# Patient Record
Sex: Male | Born: 1950 | Race: White | Hispanic: No | Marital: Married
Health system: Southern US, Community
[De-identification: ages and names within clinical notes are randomized; demographics above are authoritative.]

## PROBLEM LIST (undated history)

## (undated) DIAGNOSIS — J9621 Acute and chronic respiratory failure with hypoxia: Secondary | ICD-10-CM

## (undated) DIAGNOSIS — J80 Acute respiratory distress syndrome: Secondary | ICD-10-CM

## (undated) DIAGNOSIS — Z930 Tracheostomy status: Secondary | ICD-10-CM

## (undated) DIAGNOSIS — U071 COVID-19: Secondary | ICD-10-CM

## (undated) DIAGNOSIS — J939 Pneumothorax, unspecified: Secondary | ICD-10-CM

---

## 2020-08-22 ENCOUNTER — Other Ambulatory Visit (HOSPITAL_COMMUNITY): Payer: Medicare Other

## 2020-08-22 ENCOUNTER — Inpatient Hospital Stay
Admission: AD | Admit: 2020-08-22 | Discharge: 2020-09-23 | Disposition: A | Discharge status: Skilled Nursing Facility | Payer: Medicare Other | Source: Other Acute Inpatient Hospital | Attending: Internal Medicine | Admitting: Internal Medicine

## 2020-08-22 DIAGNOSIS — U071 COVID-19: Secondary | ICD-10-CM | POA: Diagnosis present

## 2020-08-22 DIAGNOSIS — J9690 Respiratory failure, unspecified, unspecified whether with hypoxia or hypercapnia: Secondary | ICD-10-CM

## 2020-08-22 DIAGNOSIS — Z95828 Presence of other vascular implants and grafts: Secondary | ICD-10-CM

## 2020-08-22 DIAGNOSIS — J189 Pneumonia, unspecified organism: Secondary | ICD-10-CM

## 2020-08-22 DIAGNOSIS — Z931 Gastrostomy status: Secondary | ICD-10-CM

## 2020-08-22 DIAGNOSIS — J9621 Acute and chronic respiratory failure with hypoxia: Secondary | ICD-10-CM | POA: Diagnosis present

## 2020-08-22 DIAGNOSIS — J942 Hemothorax: Secondary | ICD-10-CM

## 2020-08-22 DIAGNOSIS — R109 Unspecified abdominal pain: Secondary | ICD-10-CM

## 2020-08-22 DIAGNOSIS — J939 Pneumothorax, unspecified: Secondary | ICD-10-CM

## 2020-08-22 DIAGNOSIS — Z930 Tracheostomy status: Secondary | ICD-10-CM

## 2020-08-22 DIAGNOSIS — J80 Acute respiratory distress syndrome: Secondary | ICD-10-CM | POA: Diagnosis present

## 2020-08-22 DIAGNOSIS — Z4682 Encounter for fitting and adjustment of non-vascular catheter: Secondary | ICD-10-CM

## 2020-08-22 HISTORY — DX: Tracheostomy status: Z93.0

## 2020-08-22 HISTORY — DX: Acute and chronic respiratory failure with hypoxia: J96.21

## 2020-08-22 HISTORY — DX: Acute respiratory distress syndrome: J80

## 2020-08-22 HISTORY — DX: Pneumothorax, unspecified: J93.9

## 2020-08-22 HISTORY — DX: COVID-19: U07.1

## 2020-08-22 LAB — BLOOD GAS, ARTERIAL
Acid-Base Excess: 5.1 mmol/L — ABNORMAL HIGH (ref 0.0–2.0)
Bicarbonate: 29.5 mmol/L — ABNORMAL HIGH (ref 20.0–28.0)
FIO2: 40
O2 Saturation: 96.1 %
Patient temperature: 36.9
pCO2 arterial: 47.3 mmHg (ref 32.0–48.0)
pH, Arterial: 7.411 (ref 7.350–7.450)
pO2, Arterial: 89.2 mmHg (ref 83.0–108.0)

## 2020-08-22 MED ORDER — IOHEXOL 300 MG/ML  SOLN: 50.0000 mL | Freq: Once | INTRAMUSCULAR | Status: DC | PRN

## 2020-08-23 ENCOUNTER — Encounter: Payer: Self-pay | Admitting: Internal Medicine

## 2020-08-23 DIAGNOSIS — J9621 Acute and chronic respiratory failure with hypoxia: Secondary | ICD-10-CM | POA: Diagnosis not present

## 2020-08-23 DIAGNOSIS — Z930 Tracheostomy status: Secondary | ICD-10-CM

## 2020-08-23 DIAGNOSIS — J939 Pneumothorax, unspecified: Secondary | ICD-10-CM | POA: Diagnosis not present

## 2020-08-23 DIAGNOSIS — U071 COVID-19: Secondary | ICD-10-CM | POA: Diagnosis present

## 2020-08-23 DIAGNOSIS — J80 Acute respiratory distress syndrome: Secondary | ICD-10-CM | POA: Diagnosis present

## 2020-08-23 LAB — BLOOD GAS, ARTERIAL
Acid-Base Excess: 7 mmol/L — ABNORMAL HIGH (ref 0.0–2.0)
Bicarbonate: 31.1 mmol/L — ABNORMAL HIGH (ref 20.0–28.0)
FIO2: 40
O2 Saturation: 95.9 %
Patient temperature: 36
pCO2 arterial: 42.4 mmHg (ref 32.0–48.0)
pH, Arterial: 7.473 — ABNORMAL HIGH (ref 7.350–7.450)
pO2, Arterial: 79 mmHg — ABNORMAL LOW (ref 83.0–108.0)

## 2020-08-23 LAB — COMPREHENSIVE METABOLIC PANEL
ALT: 21 U/L (ref 0–44)
AST: 19 U/L (ref 15–41)
Albumin: 2.3 g/dL — ABNORMAL LOW (ref 3.5–5.0)
Alkaline Phosphatase: 73 U/L (ref 38–126)
Anion gap: 13 (ref 5–15)
BUN: 87 mg/dL — ABNORMAL HIGH (ref 8–23)
CO2: 29 mmol/L (ref 22–32)
Calcium: 8.8 mg/dL — ABNORMAL LOW (ref 8.9–10.3)
Chloride: 106 mmol/L (ref 98–111)
Creatinine, Ser: 1.67 mg/dL — ABNORMAL HIGH (ref 0.61–1.24)
GFR, Estimated: 45 mL/min — ABNORMAL LOW (ref >60)
Glucose, Bld: 297 mg/dL — ABNORMAL HIGH (ref 70–99)
Potassium: 3.9 mmol/L (ref 3.5–5.1)
Sodium: 148 mmol/L — ABNORMAL HIGH (ref 135–145)
Total Bilirubin: 0.6 mg/dL (ref 0.3–1.2)
Total Protein: 6.1 g/dL — ABNORMAL LOW (ref 6.5–8.1)

## 2020-08-23 LAB — CBC
HCT: 33.8 % — ABNORMAL LOW (ref 39.0–52.0)
Hemoglobin: 10.9 g/dL — ABNORMAL LOW (ref 13.0–17.0)
MCH: 29.1 pg (ref 26.0–34.0)
MCHC: 32.2 g/dL (ref 30.0–36.0)
MCV: 90.4 fL (ref 80.0–100.0)
Platelets: 323 10*3/uL (ref 150–400)
RBC: 3.74 MIL/uL — ABNORMAL LOW (ref 4.22–5.81)
RDW: 14.5 % (ref 11.5–15.5)
WBC: 16.9 10*3/uL — ABNORMAL HIGH (ref 4.0–10.5)
nRBC: 0 % (ref 0.0–0.2)

## 2020-08-23 LAB — PROTIME-INR
INR: 1.2 (ref 0.8–1.2)
Prothrombin Time: 14.6 seconds (ref 11.4–15.2)

## 2020-08-23 LAB — TSH: TSH: 0.949 u[IU]/mL (ref 0.350–4.500)

## 2020-08-23 NOTE — Consult Note (Signed)
Pulmonary Critical Care Medicine [facility] [place]  PULMONARY SERVICE  Date of Service: 08/23/2020  PULMONARY CRITICAL CARE CONSULT   Brent Farmer  MPN:[id number]  DOB: 09/09/1951   DOA: 08/22/2020  Referring Physician: Carron Curie, MD  HPI: Brent Farmer is a 69 y.o. male seen for follow up of Acute on Chronic Respiratory Failure.  Patient has multiple medical problems including respiratory failure COVID-19 presents to the hospital because of increasing shortness of breath.  Patient was diagnosed with COVID-19 had decline in respiratory status ended up having to be intubated placed on mechanical ventilation.  Subsequently was not able to come off of the ventilator and ended up having to have a tracheostomy.  Patient also developed left-sided pneumothorax requiring a chest tube which was subsequently removed on November 1.  Apparently it was dislodged the pneumothorax was still present about 2.2 cm according to the discharge summary.  He is now transferred to our facility for further management.  The current chest x-ray does not clearly show the apices as far as the pneumothorax is concerned.  Review of Systems:  ROS performed and is unremarkable other than noted above.  Past medical history: Diabetes mellitus GERD hyperlipidemia hypertension COVID-19 respiratory failure.  Past surgical history: Shoulder replacement pneumothorax tracheostomy PEG  Social history: Unknown tobacco alcohol or drug abuse  Family history: Noncontributory to the present illness.  Medications: Reviewed on Rounds  Physical Exam:  Vitals: Temperature is 96.8 pulse 99 respiratory rate 14 blood pressure is 138/71 saturations 100%  Ventilator Settings on pressure support FiO2 40% tidal volume 700 pressure 10/6  . General: Comfortable at this time . Eyes: Grossly normal lids, irises & conjunctiva . ENT: grossly tongue is normal . Neck: no obvious mass . Cardiovascular: S1-S2  normal no gallop or rub . Respiratory: No rhonchi or rales are noted at this time . Abdomen: Soft and nontender . Skin: no rash seen on limited exam . Musculoskeletal: not rigid . Psychiatric:unable to assess . Neurologic: no seizure no involuntary movements         Labs on Admission:  Basic Metabolic Panel: Recent Labs  Lab 08/23/20 0223  NA 148*  K 3.9  CL 106  CO2 29  GLUCOSE 297*  BUN 87*  CREATININE 1.67*  CALCIUM 8.8*    Recent Labs  Lab 08/22/20 1610 08/23/20 0250  PHART 7.411 7.473*  PCO2ART 47.3 42.4  PO2ART 89.2 79.0*  HCO3 29.5* 31.1*  O2SAT 96.1 95.9    Liver Function Tests: Recent Labs  Lab 08/23/20 0223  AST 19  ALT 21  ALKPHOS 73  BILITOT 0.6  PROT 6.1*  ALBUMIN 2.3*   No results for input(s): LIPASE, AMYLASE in the last 168 hours. No results for input(s): AMMONIA in the last 168 hours.  CBC: Recent Labs  Lab 08/23/20 0223  WBC 16.9*  HGB 10.9*  HCT 33.8*  MCV 90.4  PLT 323    Cardiac Enzymes: No results for input(s): CKTOTAL, CKMB, CKMBINDEX, TROPONINI in the last 168 hours.  BNP (last 3 results) No results for input(s): BNP in the last 8760 hours.  ProBNP (last 3 results) No results for input(s): PROBNP in the last 8760 hours.   Radiological Exams on Admission: DG ABDOMEN PEG TUBE LOCATION  Result Date: 08/22/2020 CLINICAL DATA:  Peg placement. EXAM: ABDOMEN - 1 VIEW COMPARISON:  None. FINDINGS: AP supine view of the abdomen obtained after the installation of 50 cc Omnipaque 300 through indwelling gastrostomy tube. Contrast opacifies the gastrostomy tubing, stomach,  proximal duodenum. There is no evidence of extravasation or leak. There is barium throughout the colon. No evidence of bowel obstruction. Calcifications project over both renal beds suspicious for nephrolithiasis. There is also a calcifications to the right of L4 that is indeterminate. There is subcutaneous emphysema in the left lateral upper abdominal/lower chest  wall. IMPRESSION: 1. Gastrostomy tube in the stomach without evidence of extravasation or leak. 2. Subcutaneous emphysema in the left lateral upper abdominal/lower chest wall. No prior exams available to assess for acuity. Recommend clinical correlation. 3. Probable bilateral nephrolithiasis. Indeterminate calcification to the right of L4, ureteral stone is not excluded. Recommend clinical correlation. Electronically Signed   By: Narda Rutherford M.D.   On: 08/22/2020 17:29   DG CHEST PORT 1 VIEW  Result Date: 08/22/2020 CLINICAL DATA:  Respiratory failure. EXAM: PORTABLE CHEST 1 VIEW COMPARISON:  None. FINDINGS: Tracheostomy tube tip at the thoracic inlet. Heart is normal in size. Mediastinal contours are normal. Ill-defined and patchy bilateral lung opacities in a peripheral and basilar predominant distribution. No significant pleural fluid. No pneumothorax. Remote left rib fractures. Widening of the right acromioclavicular joint. Reverse left shoulder arthroplasty. IMPRESSION: 1. Ill-defined and patchy bilateral lung opacities in a peripheral and basilar predominant distribution, suspicious for COVID-19 pneumonia sequela. 2. Tracheostomy tube tip at the thoracic inlet. Electronically Signed   By: Narda Rutherford M.D.   On: 08/22/2020 17:27    Assessment/Plan Active Problems:   Acute on chronic respiratory failure with hypoxia (HCC)   COVID-19 virus infection   Pneumothorax   Acute respiratory distress syndrome (ARDS) due to COVID-19 virus (HCC)   Tracheostomy status (HCC)   1. Acute on chronic respiratory failure with hypoxia patient's volumes and mechanics look very good on a pressure support of 10/6 his tidal volumes were over 700.  We'll go ahead and proceed to T collar trials. 2. COVID-19 virus infection in recovery phase we'll continue with supportive care. 3. Pneumothorax apparently had a chest tube at the other facility which have been dislodged still has some areas of subcutaneous  emphysema.  If there is a question about the pneumothorax would recommend CT to better assess. 4. ARDS secondary to COVID-19 in recovery we'll continue to monitor. 5. Tracheostomy remains in place.  I have personally seen and evaluated the patient, evaluated laboratory and imaging results, formulated the assessment and plan and placed orders. The Patient requires high complexity decision making with multiple systems involvement.  Case was discussed on Rounds with the Respiratory Therapy Director and the Respiratory staff Time Spent  Yevonne Pax, MD [facility] Pulmonary Critical Care Medicine Sleep Medicine

## 2020-08-25 DIAGNOSIS — Z930 Tracheostomy status: Secondary | ICD-10-CM | POA: Diagnosis not present

## 2020-08-25 DIAGNOSIS — J9621 Acute and chronic respiratory failure with hypoxia: Secondary | ICD-10-CM | POA: Diagnosis not present

## 2020-08-25 DIAGNOSIS — U071 COVID-19: Secondary | ICD-10-CM | POA: Diagnosis not present

## 2020-08-25 DIAGNOSIS — J939 Pneumothorax, unspecified: Secondary | ICD-10-CM | POA: Diagnosis not present

## 2020-08-25 LAB — BASIC METABOLIC PANEL
Anion gap: 11 (ref 5–15)
BUN: 84 mg/dL — ABNORMAL HIGH (ref 8–23)
CO2: 21 mmol/L — ABNORMAL LOW (ref 22–32)
Calcium: 8 mg/dL — ABNORMAL LOW (ref 8.9–10.3)
Chloride: 116 mmol/L — ABNORMAL HIGH (ref 98–111)
Creatinine, Ser: 1.35 mg/dL — ABNORMAL HIGH (ref 0.61–1.24)
GFR, Estimated: 58 mL/min — ABNORMAL LOW (ref >60)
Glucose, Bld: 294 mg/dL — ABNORMAL HIGH (ref 70–99)
Potassium: 3.7 mmol/L (ref 3.5–5.1)
Sodium: 148 mmol/L — ABNORMAL HIGH (ref 135–145)

## 2020-08-25 NOTE — Progress Notes (Signed)
Pulmonary Critical Care Medicine [facility] [place]   PULMONARY CRITICAL CARE SERVICE  PROGRESS NOTE  Date of Service: 08/25/2020  Brent Farmer   LYY:[id number]  DOB: [birth year]   DOA: 08/22/2020  Referring Physician: Carron Curie, MD  HPI: Brent Farmer is a 70 y.o. male seen for follow up of Acute on Chronic Respiratory Failure.  Off ventilator this morning on T collar has been on 35% FiO2  Medications: Reviewed on Rounds  Physical Exam:  Vitals: Temperature is 98.3 pulse 92 respiratory 20 blood pressure is 138/62 saturations 99%  Ventilator Settings on T collar with an FiO2 35%  . General: Comfortable at this time . Eyes: Grossly normal lids, irises & conjunctiva . ENT: grossly tongue is normal . Neck: no obvious mass . Cardiovascular: S1 S2 normal no gallop . Respiratory: No rhonchi no rales noted at this time . Abdomen: soft . Skin: no rash seen on limited exam . Musculoskeletal: not rigid . Psychiatric:unable to assess . Neurologic: no seizure no involuntary movements         Lab Data:   Basic Metabolic Panel: Recent Labs  Lab 08/23/20 0223  NA 148*  K 3.9  CL 106  CO2 29  GLUCOSE 297*  BUN 87*  CREATININE 1.67*  CALCIUM 8.8*    ABG: Recent Labs  Lab 08/22/20 1610 08/23/20 0250  PHART 7.411 7.473*  PCO2ART 47.3 42.4  PO2ART 89.2 79.0*  HCO3 29.5* 31.1*  O2SAT 96.1 95.9    Liver Function Tests: Recent Labs  Lab 08/23/20 0223  AST 19  ALT 21  ALKPHOS 73  BILITOT 0.6  PROT 6.1*  ALBUMIN 2.3*   No results for input(s): LIPASE, AMYLASE in the last 168 hours. No results for input(s): AMMONIA in the last 168 hours.  CBC: Recent Labs  Lab 08/23/20 0223  WBC 16.9*  HGB 10.9*  HCT 33.8*  MCV 90.4  PLT 323    Cardiac Enzymes: No results for input(s): CKTOTAL, CKMB, CKMBINDEX, TROPONINI in the last 168 hours.  BNP (last 3 results) No results for input(s): BNP in the last 8760 hours.  ProBNP (last 3 results) No  results for input(s): PROBNP in the last 8760 hours.  Radiological Exams: No results found.  Assessment/Plan Active Problems:   Acute on chronic respiratory failure with hypoxia (HCC)   COVID-19 virus infection   Pneumothorax   Acute respiratory distress syndrome (ARDS) due to COVID-19 virus (HCC)   Tracheostomy status (HCC)   1. Acute on chronic respiratory failure hypoxia we will continue with the wean on T collar as tolerated goal is 12 hours 2. COVID-19 virus infection in recovery continue supportive care 3. Pneumothorax resolved 4. Acute respiratory distress improving 5. Tracheostomy remains in place   I have personally seen and evaluated the patient, evaluated laboratory and imaging results, formulated the assessment and plan and placed orders. The Patient requires high complexity decision making with multiple systems involvement.  Rounds were done with the Respiratory Therapy Director and Staff therapists and discussed with nursing staff also.  Yevonne Pax, MD [facility] Pulmonary Critical Care Medicine Sleep Medicine

## 2020-08-26 DIAGNOSIS — J939 Pneumothorax, unspecified: Secondary | ICD-10-CM | POA: Diagnosis not present

## 2020-08-26 DIAGNOSIS — J9621 Acute and chronic respiratory failure with hypoxia: Secondary | ICD-10-CM | POA: Diagnosis not present

## 2020-08-26 DIAGNOSIS — Z930 Tracheostomy status: Secondary | ICD-10-CM | POA: Diagnosis not present

## 2020-08-26 DIAGNOSIS — U071 COVID-19: Secondary | ICD-10-CM | POA: Diagnosis not present

## 2020-08-26 NOTE — Progress Notes (Signed)
Pulmonary Critical Care Medicine [facility] [place]   PULMONARY CRITICAL CARE SERVICE  PROGRESS NOTE  Date of Service: 08/26/2020  Brent Farmer  XKG:[id number]  DOB: 06/28/1951   DOA: 08/22/2020  Referring Physician: Carron Curie, MD  HPI: Brent Farmer is a 69 y.o. male seen for follow up of Acute on Chronic Respiratory Failure.  Patient currently is on pressure support tidal volumes are greater than 1 L looks comfortable the only issue was the oxygen requirement was at 50%.  Medications: Reviewed on Rounds  Physical Exam:  Vitals: Temperature is 97.3 pulse 91 respiratory 19 blood pressure is 120/73 saturations 98%  Ventilator Settings on pressure support FiO2 50% pressure of 10/5 tidal volume 1119  . General: Comfortable at this time . Eyes: Grossly normal lids, irises & conjunctiva . ENT: grossly tongue is normal . Neck: no obvious mass . Cardiovascular: S1 S2 normal no gallop . Respiratory: No rhonchi very coarse breath sounds . Abdomen: soft . Skin: no rash seen on limited exam . Musculoskeletal: not rigid . Psychiatric:unable to assess . Neurologic: no seizure no involuntary movements         Lab Data:   Basic Metabolic Panel: Recent Labs  Lab 08/23/20 0223 08/25/20 1109  NA 148* 148*  K 3.9 3.7  CL 106 116*  CO2 29 21*  GLUCOSE 297* 294*  BUN 87* 84*  CREATININE 1.67* 1.35*  CALCIUM 8.8* 8.0*    ABG: Recent Labs  Lab 08/22/20 1610 08/23/20 0250  PHART 7.411 7.473*  PCO2ART 47.3 42.4  PO2ART 89.2 79.0*  HCO3 29.5* 31.1*  O2SAT 96.1 95.9    Liver Function Tests: Recent Labs  Lab 08/23/20 0223  AST 19  ALT 21  ALKPHOS 73  BILITOT 0.6  PROT 6.1*  ALBUMIN 2.3*   No results for input(s): LIPASE, AMYLASE in the last 168 hours. No results for input(s): AMMONIA in the last 168 hours.  CBC: Recent Labs  Lab 08/23/20 0223  WBC 16.9*  HGB 10.9*  HCT 33.8*  MCV 90.4  PLT 323    Cardiac Enzymes: No results for  input(s): CKTOTAL, CKMB, CKMBINDEX, TROPONINI in the last 168 hours.  BNP (last 3 results) No results for input(s): BNP in the last 8760 hours.  ProBNP (last 3 results) No results for input(s): PROBNP in the last 8760 hours.  Radiological Exams: No results found.  Assessment/Plan Active Problems:   Acute on chronic respiratory failure with hypoxia (HCC)   COVID-19 virus infection   Pneumothorax   Acute respiratory distress syndrome (ARDS) due to COVID-19 virus (HCC)   Tracheostomy status (HCC)   1. Acute on chronic respiratory failure hypoxia we will continue with weaning as tolerated.  Spoke with respiratory therapy during rounds to go ahead and try on T collar we may use high flow oxygen if necessary via T collar 2. COVID-19 virus infection in recovery 3. Pneumothorax resolved we will continue to monitor 4. ARDS at baseline 5. Tracheostomy remains in place   I have personally seen and evaluated the patient, evaluated laboratory and imaging results, formulated the assessment and plan and placed orders. The Patient requires high complexity decision making with multiple systems involvement.  Rounds were done with the Respiratory Therapy Director and Staff therapists and discussed with nursing staff also.  Yevonne Pax, MD [facility] Pulmonary Critical Care Medicine Sleep Medicine

## 2020-08-27 ENCOUNTER — Other Ambulatory Visit (HOSPITAL_COMMUNITY): Payer: Medicare Other

## 2020-08-27 LAB — BASIC METABOLIC PANEL
Anion gap: 10 (ref 5–15)
BUN: 61 mg/dL — ABNORMAL HIGH (ref 8–23)
CO2: 17 mmol/L — ABNORMAL LOW (ref 22–32)
Calcium: 8.2 mg/dL — ABNORMAL LOW (ref 8.9–10.3)
Chloride: 121 mmol/L — ABNORMAL HIGH (ref 98–111)
Creatinine, Ser: 0.96 mg/dL (ref 0.61–1.24)
GFR, Estimated: >60 mL/min (ref >60)
Glucose, Bld: 191 mg/dL — ABNORMAL HIGH (ref 70–99)
Potassium: 4.6 mmol/L (ref 3.5–5.1)
Sodium: 148 mmol/L — ABNORMAL HIGH (ref 135–145)

## 2020-08-27 LAB — CBC
HCT: 31.1 % — ABNORMAL LOW (ref 39.0–52.0)
Hemoglobin: 9.9 g/dL — ABNORMAL LOW (ref 13.0–17.0)
MCH: 28.9 pg (ref 26.0–34.0)
MCHC: 31.8 g/dL (ref 30.0–36.0)
MCV: 90.9 fL (ref 80.0–100.0)
Platelets: 309 10*3/uL (ref 150–400)
RBC: 3.42 MIL/uL — ABNORMAL LOW (ref 4.22–5.81)
RDW: 15.7 % — ABNORMAL HIGH (ref 11.5–15.5)
WBC: 20.7 10*3/uL — ABNORMAL HIGH (ref 4.0–10.5)
nRBC: 0 % (ref 0.0–0.2)

## 2020-08-28 ENCOUNTER — Other Ambulatory Visit (HOSPITAL_COMMUNITY): Payer: Medicare Other

## 2020-08-28 MED ORDER — SODIUM CHLORIDE 0.9 % IV SOLN
INTRAVENOUS | Status: AC | PRN
  Administered 2020-08-28: 10 mL/h via INTRAVENOUS

## 2020-08-28 MED ORDER — FENTANYL CITRATE (PF) 100 MCG/2ML IJ SOLN
INTRAMUSCULAR | Status: AC | PRN
Start: 2020-08-28 — End: 2020-08-28
  Administered 2020-08-28 (×2): 25 ug via INTRAVENOUS
  Administered 2020-08-28: 50 ug via INTRAVENOUS

## 2020-08-28 MED ORDER — MIDAZOLAM HCL 2 MG/2ML IJ SOLN
INTRAMUSCULAR | Status: AC
  Filled 2020-08-28: qty 2

## 2020-08-28 MED ORDER — FENTANYL CITRATE (PF) 100 MCG/2ML IJ SOLN
INTRAMUSCULAR | Status: AC
  Filled 2020-08-28: qty 2

## 2020-08-28 MED ORDER — MIDAZOLAM HCL 2 MG/2ML IJ SOLN
INTRAMUSCULAR | Status: AC | PRN
  Administered 2020-08-28: 0.5 mg via INTRAVENOUS
  Administered 2020-08-28: 1 mg via INTRAVENOUS
  Administered 2020-08-28: 0.5 mg via INTRAVENOUS

## 2020-08-28 NOTE — Procedures (Signed)
Interventional Radiology Procedure Note  Procedure: CT guided left chest tube placement  Complications: None  Estimated Blood Loss: None  Findings: 12 Fr pigtail thoracostomy tube placed in left pleural space from anterolateral approach. Connected to [employer] device.  Jodi Marble. Fredia Sorrow, M.D Pager:  [phone]

## 2020-08-28 NOTE — Consult Note (Signed)
Chief Complaint: Patient was seen in consultation today for left chest tube drain at the request of Dr Ardeth Sportsman   Supervising Physician: Irish Lack  Patient Status: [facility] IP  History of Present Illness: Brent Farmer is a 69 y.o. male   Dx Covid and was admitted to outside facility Respiratory failure; requiring intubation-- ventilation-- trach Also required G tube while there  Developed Left PTX and was managed with Left chest tube Removed 11/1 - at different faciliy -- notes indicate CT was removed secondary malposition; still with small PTX at time of removal  Tx to [facility] for vent management  Serial CXR revealing enlarging PTX IMPRESSION: Increasing left pneumothorax which is now moderate. Unchanged generalized pulmonary infiltrates.  Request now for new placement of chest tube drain placement Imaging reviewed with Dr Fredia Sorrow He approves procedure   Past Medical History:  Diagnosis Date  . Acute on chronic respiratory failure with hypoxia (HCC)   . Acute respiratory distress syndrome (ARDS) due to COVID-19 virus (HCC)   . COVID-19 virus infection   . Pneumothorax   . Tracheostomy status (HCC)     Allergies: Patient has no allergy information on record.  Medications: Prior to Admission medications   Not on File     No family history on file.  Social History   Socioeconomic History  . Marital status: Married    Spouse name: Not on file  . Number of children: Not on file  . Years of education: Not on file  . Highest education level: Not on file  Occupational History  . Not on file  Tobacco Use  . Smoking status: Not on file  Substance and Sexual Activity  . Alcohol use: Not on file  . Drug use: Not on file  . Sexual activity: Not on file  Other Topics Concern  . Not on file  Social History Narrative  . Not on file   Social Determinants of Health   Financial Resource Strain:   . Difficulty of Paying Living Expenses: Not on file   Food Insecurity:   . Worried About Programme researcher, broadcasting/film/video in the Last Year: Not on file  . Ran Out of Food in the Last Year: Not on file  Transportation Needs:   . Lack of Transportation (Medical): Not on file  . Lack of Transportation (Non-Medical): Not on file  Physical Activity:   . Days of Exercise per Week: Not on file  . Minutes of Exercise per Session: Not on file  Stress:   . Feeling of Stress : Not on file  Social Connections:   . Frequency of Communication with Friends and Family: Not on file  . Frequency of Social Gatherings with Friends and Family: Not on file  . Attends Religious Services: Not on file  . Active Member of Clubs or Organizations: Not on file  . Attends Banker Meetings: Not on file  . Marital Status: Not on file    Review of Systems: A 12 point ROS discussed and pertinent positives are indicated in the HPI above.  All other systems are negative.   Vital Signs: There were no vitals taken for this visit.  Physical Exam HENT:     Mouth/Throat:     Mouth: Mucous membranes are moist.  Cardiovascular:     Rate and Rhythm: Normal rate.     Heart sounds: Normal heart sounds.  Pulmonary:     Comments: Trach/vent Skin:    General: Skin is warm.  Psychiatric:  Comments: Consented with wife Brent Farmer over phone     Imaging: DG ABDOMEN PEG TUBE LOCATION  Result Date: 08/22/2020 CLINICAL DATA:  Peg placement. EXAM: ABDOMEN - 1 VIEW COMPARISON:  None. FINDINGS: AP supine view of the abdomen obtained after the installation of 50 cc Omnipaque 300 through indwelling gastrostomy tube. Contrast opacifies the gastrostomy tubing, stomach, proximal duodenum. There is no evidence of extravasation or leak. There is barium throughout the colon. No evidence of bowel obstruction. Calcifications project over both renal beds suspicious for nephrolithiasis. There is also a calcifications to the right of L4 that is indeterminate. There is subcutaneous emphysema in  the left lateral upper abdominal/lower chest wall. IMPRESSION: 1. Gastrostomy tube in the stomach without evidence of extravasation or leak. 2. Subcutaneous emphysema in the left lateral upper abdominal/lower chest wall. No prior exams available to assess for acuity. Recommend clinical correlation. 3. Probable bilateral nephrolithiasis. Indeterminate calcification to the right of L4, ureteral stone is not excluded. Recommend clinical correlation. Electronically Signed   By: Narda Rutherford M.D.   On: 08/22/2020 17:29   DG CHEST PORT 1 VIEW  Result Date: 08/28/2020 CLINICAL DATA:  Pneumothorax EXAM: PORTABLE CHEST 1 VIEW COMPARISON:  Yesterday FINDINGS: Increasing left pneumothorax which is now moderate volume. Laterally, the pneumothorax measures 2.8 cm in thickness, previously 1.3 cm. No mediastinal shift or change in diaphragm morphology. Diffuse coarse infiltrates. Normal heart size and mediastinal contours. Tracheostomy tube in place. Right PICC with tip at the right subclavian level. As soon as possible, these results will be called to the ordering clinician or representative by the Radiologist Assistant, and communication documented in the PACS or [employer]. IMPRESSION: Increasing left pneumothorax which is now moderate. Unchanged generalized pulmonary infiltrates. Electronically Signed   By: Marnee Spring M.D.   On: 08/28/2020 06:50   DG Chest Port 1 View  Result Date: 08/27/2020 CLINICAL DATA:  Respiratory failure. EXAM: PORTABLE CHEST 1 VIEW COMPARISON:  Five days ago FINDINGS: Right PICC with tip at the subclavian. Tracheostomy tube in place. Coarsened lung markings on both sides. There is a lucency along the lateral left chest wall with no lung markings beyond this interface, which also continues along the left diaphragm. Normal heart size and stable mediastinal contours. These results were called by telephone at the time of interpretation on 08/27/2020 at 7:07 am to provider ALI HIJAZI  , who verbally acknowledged these results. IMPRESSION: 1. Small left lateral and basal pneumothorax since prior. 2. Stable interstitial opacities. 3. Right PICC with tip near the right subclavian. Electronically Signed   By: Marnee Spring M.D.   On: 08/27/2020 07:07   DG CHEST PORT 1 VIEW  Result Date: 08/22/2020 CLINICAL DATA:  Respiratory failure. EXAM: PORTABLE CHEST 1 VIEW COMPARISON:  None. FINDINGS: Tracheostomy tube tip at the thoracic inlet. Heart is normal in size. Mediastinal contours are normal. Ill-defined and patchy bilateral lung opacities in a peripheral and basilar predominant distribution. No significant pleural fluid. No pneumothorax. Remote left rib fractures. Widening of the right acromioclavicular joint. Reverse left shoulder arthroplasty. IMPRESSION: 1. Ill-defined and patchy bilateral lung opacities in a peripheral and basilar predominant distribution, suspicious for COVID-19 pneumonia sequela. 2. Tracheostomy tube tip at the thoracic inlet. Electronically Signed   By: Narda Rutherford M.D.   On: 08/22/2020 17:27    Labs:  CBC: Recent Labs    08/23/20 0223 08/27/20 0643  WBC 16.9* 20.7*  HGB 10.9* 9.9*  HCT 33.8* 31.1*  PLT 323 309  COAGS: Recent Labs    08/23/20 0223  INR 1.2    BMP: Recent Labs    08/23/20 0223 08/25/20 1109 08/27/20 0643  NA 148* 148* 148*  K 3.9 3.7 4.6  CL 106 116* 121*  CO2 29 21* 17*  GLUCOSE 297* 294* 191*  BUN 87* 84* 61*  CALCIUM 8.8* 8.0* 8.2*  CREATININE 1.67* 1.35* 0.96  GFRNONAA 45* 58* >60    LIVER FUNCTION TESTS: Recent Labs    08/23/20 0223  BILITOT 0.6  AST 19  ALT 21  ALKPHOS 73  PROT 6.1*  ALBUMIN 2.3*    TUMOR MARKERS: No results for input(s): AFPTM, CEA, CA199, CHROMGRNA in the last 8760 hours.  Assessment and Plan:  Left PTX post Covid infection Post Chest tube removal 08/19/20 Transfer to [facility] for vent management Recurrent L PTX; enlarging- scheduled today for new left CT  placement Risks and benefits discussed with the patient's wife Brent Farmer via phone including bleeding, infection, damage to adjacent structures.  All questions were answered, she is agreeable to proceed. Consent signed and in chart.  Thank you for this interesting consult.  I greatly enjoyed meeting [employer] and look forward to participating in their care.  A copy of this report was sent to the requesting provider on this date.  Electronically Signed: Robet Leu, PA-C 08/28/2020, 10:16 AM   I spent a total of 40 Minutes    in face to face in clinical consultation, greater than 50% of which was counseling/coordinating care for left chest tube drain placement

## 2020-08-29 ENCOUNTER — Other Ambulatory Visit (HOSPITAL_COMMUNITY): Payer: Medicare Other

## 2020-08-29 DIAGNOSIS — Z930 Tracheostomy status: Secondary | ICD-10-CM | POA: Diagnosis not present

## 2020-08-29 DIAGNOSIS — J9621 Acute and chronic respiratory failure with hypoxia: Secondary | ICD-10-CM | POA: Diagnosis not present

## 2020-08-29 DIAGNOSIS — J939 Pneumothorax, unspecified: Secondary | ICD-10-CM | POA: Diagnosis not present

## 2020-08-29 DIAGNOSIS — U071 COVID-19: Secondary | ICD-10-CM | POA: Diagnosis not present

## 2020-08-29 LAB — BLOOD GAS, ARTERIAL
Acid-base deficit: 7.1 mmol/L — ABNORMAL HIGH (ref 0.0–2.0)
Bicarbonate: 16.2 mmol/L — ABNORMAL LOW (ref 20.0–28.0)
FIO2: 35
O2 Saturation: 90.5 %
Patient temperature: 36.9
pCO2 arterial: 23.4 mmHg — ABNORMAL LOW (ref 32.0–48.0)
pH, Arterial: 7.453 — ABNORMAL HIGH (ref 7.350–7.450)
pO2, Arterial: 58.8 mmHg — ABNORMAL LOW (ref 83.0–108.0)

## 2020-08-29 LAB — CBC
HCT: 30.4 % — ABNORMAL LOW (ref 39.0–52.0)
Hemoglobin: 9.6 g/dL — ABNORMAL LOW (ref 13.0–17.0)
MCH: 29.3 pg (ref 26.0–34.0)
MCHC: 31.6 g/dL (ref 30.0–36.0)
MCV: 92.7 fL (ref 80.0–100.0)
Platelets: 228 10*3/uL (ref 150–400)
RBC: 3.28 MIL/uL — ABNORMAL LOW (ref 4.22–5.81)
RDW: 16.4 % — ABNORMAL HIGH (ref 11.5–15.5)
WBC: 16.1 10*3/uL — ABNORMAL HIGH (ref 4.0–10.5)
nRBC: 0.2 % (ref 0.0–0.2)

## 2020-08-29 LAB — BASIC METABOLIC PANEL
Anion gap: 9 (ref 5–15)
BUN: 72 mg/dL — ABNORMAL HIGH (ref 8–23)
CO2: 17 mmol/L — ABNORMAL LOW (ref 22–32)
Calcium: 8.1 mg/dL — ABNORMAL LOW (ref 8.9–10.3)
Chloride: 125 mmol/L — ABNORMAL HIGH (ref 98–111)
Creatinine, Ser: 1 mg/dL (ref 0.61–1.24)
GFR, Estimated: >60 mL/min (ref >60)
Glucose, Bld: 102 mg/dL — ABNORMAL HIGH (ref 70–99)
Potassium: 4.5 mmol/L (ref 3.5–5.1)
Sodium: 151 mmol/L — ABNORMAL HIGH (ref 135–145)

## 2020-08-29 NOTE — Progress Notes (Addendum)
Referring Physician(s): Dr Manson Passey  Supervising Physician: Dr Marliss Coots  Patient Status:  Select IP  Chief Complaint:  Left PTX  Subjective:  CT placed 11/10 Findings: 12 Fr pigtail thoracostomy tube placed in left pleural space from anterolateral approach. Connected to [employer] device.  Pt more alert today CT not connected to suction-- now is connected to wall     Allergies: Patient has no allergy information on record.  Medications: Prior to Admission medications   Not on File     Vital Signs: BP (!) 93/51   Pulse 76   Resp 16   SpO2 98%   Physical Exam Skin:    General: Skin is warm.     Comments: CT intact No collection in device ++ air leak  Skin site clean and dry     Imaging: DG CHEST PORT 1 VIEW  Result Date: 08/28/2020 CLINICAL DATA:  Pneumothorax EXAM: PORTABLE CHEST 1 VIEW COMPARISON:  Yesterday FINDINGS: Increasing left pneumothorax which is now moderate volume. Laterally, the pneumothorax measures 2.8 cm in thickness, previously 1.3 cm. No mediastinal shift or change in diaphragm morphology. Diffuse coarse infiltrates. Normal heart size and mediastinal contours. Tracheostomy tube in place. Right PICC with tip at the right subclavian level. As soon as possible, these results will be called to the ordering clinician or representative by the Radiologist Assistant, and communication documented in the PACS or [employer]. IMPRESSION: Increasing left pneumothorax which is now moderate. Unchanged generalized pulmonary infiltrates. Electronically Signed   By: Marnee Spring M.D.   On: 08/28/2020 06:50   DG Chest Port 1 View  Result Date: 08/27/2020 CLINICAL DATA:  Respiratory failure. EXAM: PORTABLE CHEST 1 VIEW COMPARISON:  Five days ago FINDINGS: Right PICC with tip at the subclavian. Tracheostomy tube in place. Coarsened lung markings on both sides. There is a lucency along the lateral left chest wall with no lung markings beyond  this interface, which also continues along the left diaphragm. Normal heart size and stable mediastinal contours. These results were called by telephone at the time of interpretation on 08/27/2020 at 7:07 am to provider ALI HIJAZI , who verbally acknowledged these results. IMPRESSION: 1. Small left lateral and basal pneumothorax since prior. 2. Stable interstitial opacities. 3. Right PICC with tip near the right subclavian. Electronically Signed   By: Marnee Spring M.D.   On: 08/27/2020 07:07   CT IMAGE GUIDED DRAINAGE BY PERCUTANEOUS CATHETER  Result Date: 08/29/2020 INDICATION: Respiratory failure secondary to prior COVID-19 pneumonia and enlarging chronic left pneumothorax. EXAM: CT-GUIDED LEFT THORACOSTOMY TUBE PLACEMENT MEDICATIONS: None ANESTHESIA/SEDATION: Fentanyl 100 mcg IV; Versed 2.0 mg IV Moderate Sedation Time:  20 minutes. The patient was continuously monitored during the procedure by the interventional radiology nurse under my direct supervision. COMPLICATIONS: None immediate. PROCEDURE: Informed written consent was obtained from the patient's wife after a thorough discussion of the procedural risks, benefits and alternatives. All questions were addressed. Maximal Sterile Barrier Technique was utilized including caps, mask, sterile gowns, sterile gloves, sterile drape, hand hygiene and skin antiseptic. A timeout was performed prior to the initiation of the procedure. CT was performed of the chest in a supine position. From an anterolateral approach, an 18 gauge trocar needle was advanced into the pleural space. After confirming needle tip position, a guidewire was advanced and the needle removed. After tract dilatation, a 12 French pigtail catheter was advanced into the pleural space. Catheter position was confirmed by CT. The catheter was attached to a [employer]  device. It was secured at the skin with a Prolene retention suture and StatLock device. FINDINGS: Moderate left anterior and  lateral pneumothorax present. There also is a small amount of air in the mediastinum. Both lungs demonstrate significant parenchymal opacity consistent with lung injury likely secondary to the known history of prior COVID-19 pneumonia. No pleural effusions identified. The left thoracostomy tube was placed in the anterior pleural space. The Pleur-evac will be connected to wall suction at -20 cm of water. IMPRESSION: CT-guided placement of 12 French left thoracostomy tube in the anterior pleural space. The tube was connected to a Pleur-evac which will be connected to wall suction at -20 cm of water. Electronically Signed   By: Irish Lack M.D.   On: 08/29/2020 08:11    Labs:  CBC: Recent Labs    08/23/20 0223 08/27/20 0643 08/29/20 0440  WBC 16.9* 20.7* 16.1*  HGB 10.9* 9.9* 9.6*  HCT 33.8* 31.1* 30.4*  PLT 323 309 228    COAGS: Recent Labs    08/23/20 0223  INR 1.2    BMP: Recent Labs    08/23/20 0223 08/25/20 1109 08/27/20 0643 08/29/20 0440  NA 148* 148* 148* 151*  K 3.9 3.7 4.6 4.5  CL 106 116* 121* 125*  CO2 29 21* 17* 17*  GLUCOSE 297* 294* 191* 102*  BUN 87* 84* 61* 72*  CALCIUM 8.8* 8.0* 8.2* 8.1*  CREATININE 1.67* 1.35* 0.96 1.00  GFRNONAA 45* 58* >60 >60    LIVER FUNCTION TESTS: Recent Labs    08/23/20 0223  BILITOT 0.6  AST 19  ALT 21  ALKPHOS 73  PROT 6.1*  ALBUMIN 2.3*    Assessment and Plan:  Left PTX Post CT placement in IR CXR pending  Electronically Signed: Robet Leu, PA-C 08/29/2020, 9:35 AM   I spent a total of 15 Minutes at the the patient's bedside AND on the patient's hospital floor or unit, greater than 50% of which was counseling/coordinating care for left chest tube

## 2020-08-29 NOTE — Progress Notes (Addendum)
Pulmonary Critical Care Medicine [facility] [place]   PULMONARY CRITICAL CARE SERVICE  PROGRESS NOTE  Date of Service: 08/29/2020  Brent Farmer  VOZ:[id number]  DOB: 1951/09/25   DOA: 08/22/2020  Referring Physician: Carron Curie, MD  HPI: Brent Farmer is a 69 y.o. male seen for follow up of Acute on Chronic Respiratory Failure.  Patient at this time is on T collar 35% FiO2 will be completing 24 hours.  Medications: Reviewed on Rounds  Physical Exam:  Vitals: Temperature 96.9 pulse 96 respiratory 19 blood pressure is 121/64 saturations 93%  Ventilator Settings on T collar FiO2 35%  . General: Comfortable at this time . Eyes: Grossly normal lids, irises & conjunctiva . ENT: grossly tongue is normal . Neck: no obvious mass . Cardiovascular: S1 S2 normal no gallop . Respiratory: No rhonchi no rales. . Abdomen: soft . Skin: no rash seen on limited exam . Musculoskeletal: not rigid . Psychiatric:unable to assess . Neurologic: no seizure no involuntary movements         Lab Data:   Basic Metabolic Panel: Recent Labs  Lab 08/23/20 0223 08/25/20 1109 08/27/20 0643 08/29/20 0440  NA 148* 148* 148* 151*  K 3.9 3.7 4.6 4.5  CL 106 116* 121* 125*  CO2 29 21* 17* 17*  GLUCOSE 297* 294* 191* 102*  BUN 87* 84* 61* 72*  CREATININE 1.67* 1.35* 0.96 1.00  CALCIUM 8.8* 8.0* 8.2* 8.1*    ABG: Recent Labs  Lab 08/22/20 1610 08/23/20 0250 08/29/20 0840  PHART 7.411 7.473* 7.453*  PCO2ART 47.3 42.4 23.4*  PO2ART 89.2 79.0* 58.8*  HCO3 29.5* 31.1* 16.2*  O2SAT 96.1 95.9 90.5    Liver Function Tests: Recent Labs  Lab 08/23/20 0223  AST 19  ALT 21  ALKPHOS 73  BILITOT 0.6  PROT 6.1*  ALBUMIN 2.3*   No results for input(s): LIPASE, AMYLASE in the last 168 hours. No results for input(s): AMMONIA in the last 168 hours.  CBC: Recent Labs  Lab 08/23/20 0223 08/27/20 0643 08/29/20 0440  WBC 16.9* 20.7* 16.1*  HGB 10.9* 9.9* 9.6*  HCT 33.8*  31.1* 30.4*  MCV 90.4 90.9 92.7  PLT 323 309 228    Cardiac Enzymes: No results for input(s): CKTOTAL, CKMB, CKMBINDEX, TROPONINI in the last 168 hours.  BNP (last 3 results) No results for input(s): BNP in the last 8760 hours.  ProBNP (last 3 results) No results for input(s): PROBNP in the last 8760 hours.  Radiological Exams: DG CHEST PORT 1 VIEW  Result Date: 08/28/2020 CLINICAL DATA:  Pneumothorax EXAM: PORTABLE CHEST 1 VIEW COMPARISON:  Yesterday FINDINGS: Increasing left pneumothorax which is now moderate volume. Laterally, the pneumothorax measures 2.8 cm in thickness, previously 1.3 cm. No mediastinal shift or change in diaphragm morphology. Diffuse coarse infiltrates. Normal heart size and mediastinal contours. Tracheostomy tube in place. Right PICC with tip at the right subclavian level. As soon as possible, these results will be called to the ordering clinician or representative by the Radiologist Assistant, and communication documented in the PACS or [employer]. IMPRESSION: Increasing left pneumothorax which is now moderate. Unchanged generalized pulmonary infiltrates. Electronically Signed   By: Marnee Spring M.D.   On: 08/28/2020 06:50   CT IMAGE GUIDED DRAINAGE BY PERCUTANEOUS CATHETER  Result Date: 08/29/2020 INDICATION: Respiratory failure secondary to prior COVID-19 pneumonia and enlarging chronic left pneumothorax. EXAM: CT-GUIDED LEFT THORACOSTOMY TUBE PLACEMENT MEDICATIONS: None ANESTHESIA/SEDATION: Fentanyl 100 mcg IV; Versed 2.0 mg IV Moderate Sedation Time:  20 minutes.  The patient was continuously monitored during the procedure by the interventional radiology nurse under my direct supervision. COMPLICATIONS: None immediate. PROCEDURE: Informed written consent was obtained from the patient's wife after a thorough discussion of the procedural risks, benefits and alternatives. All questions were addressed. Maximal Sterile Barrier Technique was utilized including  caps, mask, sterile gowns, sterile gloves, sterile drape, hand hygiene and skin antiseptic. A timeout was performed prior to the initiation of the procedure. CT was performed of the chest in a supine position. From an anterolateral approach, an 18 gauge trocar needle was advanced into the pleural space. After confirming needle tip position, a guidewire was advanced and the needle removed. After tract dilatation, a 12 French pigtail catheter was advanced into the pleural space. Catheter position was confirmed by CT. The catheter was attached to a [employer] device. It was secured at the skin with a Prolene retention suture and StatLock device. FINDINGS: Moderate left anterior and lateral pneumothorax present. There also is a small amount of air in the mediastinum. Both lungs demonstrate significant parenchymal opacity consistent with lung injury likely secondary to the known history of prior COVID-19 pneumonia. No pleural effusions identified. The left thoracostomy tube was placed in the anterior pleural space. The Pleur-evac will be connected to wall suction at -20 cm of water. IMPRESSION: CT-guided placement of 12 French left thoracostomy tube in the anterior pleural space. The tube was connected to a Pleur-evac which will be connected to wall suction at -20 cm of water. Electronically Signed   By: Irish Lack M.D.   On: 08/29/2020 08:11    Assessment/Plan Active Problems:   Acute on chronic respiratory failure with hypoxia (HCC)   COVID-19 virus infection   Pneumothorax   Acute respiratory distress syndrome (ARDS) due to COVID-19 virus (HCC)   Tracheostomy status (HCC)   1. Acute on chronic respiratory failure with hypoxia continue T collar trials patient is on goal of 24 hours 2. COVID-19 virus infection resolved 3. Pneumothorax patient is at baseline had a chest tube placed yesterday 4. ARDS treated we will continue with supportive care 5. Tracheostomy remains in place   I have  personally seen and evaluated the patient, evaluated laboratory and imaging results, formulated the assessment and plan and placed orders. The Patient requires high complexity decision making with multiple systems involvement.  Rounds were done with the Respiratory Therapy Director and Staff therapists and discussed with nursing staff also.  Yevonne Pax, MD [facility] Pulmonary Critical Care Medicine Sleep Medicine

## 2020-08-30 ENCOUNTER — Other Ambulatory Visit (HOSPITAL_COMMUNITY): Payer: Medicare Other

## 2020-08-30 DIAGNOSIS — J939 Pneumothorax, unspecified: Secondary | ICD-10-CM | POA: Diagnosis not present

## 2020-08-30 DIAGNOSIS — U071 COVID-19: Secondary | ICD-10-CM | POA: Diagnosis not present

## 2020-08-30 DIAGNOSIS — J9621 Acute and chronic respiratory failure with hypoxia: Secondary | ICD-10-CM | POA: Diagnosis not present

## 2020-08-30 DIAGNOSIS — Z930 Tracheostomy status: Secondary | ICD-10-CM | POA: Diagnosis not present

## 2020-08-30 LAB — CULTURE, RESPIRATORY W GRAM STAIN: Culture: NORMAL

## 2020-08-30 NOTE — Progress Notes (Signed)
Referring Physician(s): Shayne Alken [facility])  Supervising Physician: Irish Lack  Patient Status:  [facility]  Chief Complaint: None  Subjective:  History of respiratory failure secondary to prior COVID-19 pneumonia complicated by enlarging left PTX s/p left chest tube placement in IR 08/28/2020. Patient laying in bed resting comfortably, tracheostomy in place, no sedation. He opens eyes to voice and nods appropriately to yes/no questions. Denies complaints. Left chest tube site c/d/i. Sating 96% with tracheostomy in place.  CXR this AM: 1. Decreased left pneumothorax with stable chest tube position.   Allergies: Patient has no allergy information on record.  Medications: Prior to Admission medications   Not on File     Vital Signs: BP (!) 93/51   Pulse 76   Resp 16   SpO2 98%   Physical Exam Vitals and nursing note reviewed.  Constitutional:      General: He is not in acute distress.    Comments: Tracheostomy without sedation.  Pulmonary:     Effort: Pulmonary effort is normal. No respiratory distress.     Comments: Tracheostomy without sedation. Left chest tube site without tenderness, erythema, drainage, or active bleeding; approximately 15 cc serous fluid in pleure-vac; tube to suction with (+) air leak. Skin:    General: Skin is warm and dry.  Neurological:     Mental Status: He is alert.     Imaging: DG Chest Port 1 View  Result Date: 08/30/2020 CLINICAL DATA:  Pneumothorax and chest tube EXAM: PORTABLE CHEST 1 VIEW COMPARISON:  Yesterday FINDINGS: Left chest tube in stable position. Apicolateral left pneumothorax which is small to moderate and decreased from prior. Diffuse coarse interstitial opacities. Right PICC with tip at the right subclavian to brachiocephalic level. Tracheostomy tube in place. Normal heart size. IMPRESSION: Decreased left pneumothorax with stable chest tube position. Electronically Signed   By: Marnee Spring M.D.    On: 08/30/2020 05:58   DG CHEST PORT 1 VIEW  Result Date: 08/29/2020 CLINICAL DATA:  Status post left thoracostomy placement EXAM: PORTABLE CHEST 1 VIEW COMPARISON:  08/28/2020 FINDINGS: Cardiac shadow is stable. Tracheostomy tube and right-sided PICC line are again noted and stable. PICC line catheter tip is noted in the right clavi in vein. New pigtail catheter is noted on the left with slight reduction in left pneumothorax. Increased parenchymal markings are noted within the left lung due to the collapse. Right lung remains clear. Multiple old rib fractures are noted on the left. IMPRESSION: Pigtail catheter now seen on the left with slight reduction in pneumothorax. Continued follow-up is recommended. Electronically Signed   By: Alcide Clever M.D.   On: 08/29/2020 09:47   DG CHEST PORT 1 VIEW  Result Date: 08/28/2020 CLINICAL DATA:  Pneumothorax EXAM: PORTABLE CHEST 1 VIEW COMPARISON:  Yesterday FINDINGS: Increasing left pneumothorax which is now moderate volume. Laterally, the pneumothorax measures 2.8 cm in thickness, previously 1.3 cm. No mediastinal shift or change in diaphragm morphology. Diffuse coarse infiltrates. Normal heart size and mediastinal contours. Tracheostomy tube in place. Right PICC with tip at the right subclavian level. As soon as possible, these results will be called to the ordering clinician or representative by the Radiologist Assistant, and communication documented in the PACS or [employer]. IMPRESSION: Increasing left pneumothorax which is now moderate. Unchanged generalized pulmonary infiltrates. Electronically Signed   By: Marnee Spring M.D.   On: 08/28/2020 06:50   DG Chest Port 1 View  Result Date: 08/27/2020 CLINICAL DATA:  Respiratory failure. EXAM: PORTABLE  CHEST 1 VIEW COMPARISON:  Five days ago FINDINGS: Right PICC with tip at the subclavian. Tracheostomy tube in place. Coarsened lung markings on both sides. There is a lucency along the lateral left  chest wall with no lung markings beyond this interface, which also continues along the left diaphragm. Normal heart size and stable mediastinal contours. These results were called by telephone at the time of interpretation on 08/27/2020 at 7:07 am to provider ALI HIJAZI , who verbally acknowledged these results. IMPRESSION: 1. Small left lateral and basal pneumothorax since prior. 2. Stable interstitial opacities. 3. Right PICC with tip near the right subclavian. Electronically Signed   By: Marnee Spring M.D.   On: 08/27/2020 07:07   CT IMAGE GUIDED DRAINAGE BY PERCUTANEOUS CATHETER  Result Date: 08/29/2020 INDICATION: Respiratory failure secondary to prior COVID-19 pneumonia and enlarging chronic left pneumothorax. EXAM: CT-GUIDED LEFT THORACOSTOMY TUBE PLACEMENT MEDICATIONS: None ANESTHESIA/SEDATION: Fentanyl 100 mcg IV; Versed 2.0 mg IV Moderate Sedation Time:  20 minutes. The patient was continuously monitored during the procedure by the interventional radiology nurse under my direct supervision. COMPLICATIONS: None immediate. PROCEDURE: Informed written consent was obtained from the patient's wife after a thorough discussion of the procedural risks, benefits and alternatives. All questions were addressed. Maximal Sterile Barrier Technique was utilized including caps, mask, sterile gowns, sterile gloves, sterile drape, hand hygiene and skin antiseptic. A timeout was performed prior to the initiation of the procedure. CT was performed of the chest in a supine position. From an anterolateral approach, an 18 gauge trocar needle was advanced into the pleural space. After confirming needle tip position, a guidewire was advanced and the needle removed. After tract dilatation, a 12 French pigtail catheter was advanced into the pleural space. Catheter position was confirmed by CT. The catheter was attached to a [employer] device. It was secured at the skin with a Prolene retention suture and [employer]  device. FINDINGS: Moderate left anterior and lateral pneumothorax present. There also is a small amount of air in the mediastinum. Both lungs demonstrate significant parenchymal opacity consistent with lung injury likely secondary to the known history of prior COVID-19 pneumonia. No pleural effusions identified. The left thoracostomy tube was placed in the anterior pleural space. The Pleur-evac will be connected to wall suction at -20 cm of water. IMPRESSION: CT-guided placement of 12 French left thoracostomy tube in the anterior pleural space. The tube was connected to a Pleur-evac which will be connected to wall suction at -20 cm of water. Electronically Signed   By: Irish Lack M.D.   On: 08/29/2020 08:11    Labs:  CBC: Recent Labs    08/23/20 0223 08/27/20 0643 08/29/20 0440  WBC 16.9* 20.7* 16.1*  HGB 10.9* 9.9* 9.6*  HCT 33.8* 31.1* 30.4*  PLT 323 309 228    COAGS: Recent Labs    08/23/20 0223  INR 1.2    BMP: Recent Labs    08/23/20 0223 08/25/20 1109 08/27/20 0643 08/29/20 0440  NA 148* 148* 148* 151*  K 3.9 3.7 4.6 4.5  CL 106 116* 121* 125*  CO2 29 21* 17* 17*  GLUCOSE 297* 294* 191* 102*  BUN 87* 84* 61* 72*  CALCIUM 8.8* 8.0* 8.2* 8.1*  CREATININE 1.67* 1.35* 0.96 1.00  GFRNONAA 45* 58* >60 >60    LIVER FUNCTION TESTS: Recent Labs    08/23/20 0223  BILITOT 0.6  AST 19  ALT 21  ALKPHOS 73  PROT 6.1*  ALBUMIN 2.3*    Assessment and  Plan:  History of respiratory failure secondary to prior COVID-19 pneumonia complicated by enlarging left PTX s/p left chest tube placement in IR 08/28/2020. Left chest tube stable with approximately 15 cc serous fluid in pleure-vac, tube to suction with (+) air leak. Continue current chest tube management- recommend QD CXR and monitoring of output. Tube to be managed by [facility] pulmonology. Further plans per [facility]- appreciate and agree with management. IR will continue to follow from a distance, please call IR with  questions/concerns.   Electronically Signed: Elwin Mocha, PA-C 08/30/2020, 11:09 AM   I spent a total of 15 Minutes at the the patient's bedside AND on the patient's hospital floor or unit, greater than 50% of which was counseling/coordinating care for left PTX s/p left chest tube placement.

## 2020-08-30 NOTE — Progress Notes (Signed)
Pulmonary Critical Care Medicine [facility] [place]   PULMONARY CRITICAL CARE SERVICE  PROGRESS NOTE  Date of Service: 08/30/2020  Brent Farmer  JJK:[id number]  DOB: 1951-07-24   DOA: 08/22/2020  Referring Physician: Carron Curie, MD  HPI: Brent Farmer is a 69 y.o. male seen for follow up of Acute on Chronic Respiratory Failure.  Patient currently is on T collar has been on 35% FiO2 with a goal of 48 hours.  Medications: Reviewed on Rounds  Physical Exam:  Vitals: Temperature is 97.0 pulse 73 respiratory rate 16 blood pressure is 108/60 saturations 98%  Ventilator Settings on T collar with an FiO2 of 35%  . General: Comfortable at this time . Eyes: Grossly normal lids, irises & conjunctiva . ENT: grossly tongue is normal . Neck: no obvious mass . Cardiovascular: S1 S2 normal no gallop . Respiratory: No rhonchi very coarse breath sounds . Abdomen: soft . Skin: no rash seen on limited exam . Musculoskeletal: not rigid . Psychiatric:unable to assess . Neurologic: no seizure no involuntary movements         Lab Data:   Basic Metabolic Panel: Recent Labs  Lab 08/25/20 1109 08/27/20 0643 08/29/20 0440  NA 148* 148* 151*  K 3.7 4.6 4.5  CL 116* 121* 125*  CO2 21* 17* 17*  GLUCOSE 294* 191* 102*  BUN 84* 61* 72*  CREATININE 1.35* 0.96 1.00  CALCIUM 8.0* 8.2* 8.1*    ABG: Recent Labs  Lab 08/29/20 0840  PHART 7.453*  PCO2ART 23.4*  PO2ART 58.8*  HCO3 16.2*  O2SAT 90.5    Liver Function Tests: No results for input(s): AST, ALT, ALKPHOS, BILITOT, PROT, ALBUMIN in the last 168 hours. No results for input(s): LIPASE, AMYLASE in the last 168 hours. No results for input(s): AMMONIA in the last 168 hours.  CBC: Recent Labs  Lab 08/27/20 0643 08/29/20 0440  WBC 20.7* 16.1*  HGB 9.9* 9.6*  HCT 31.1* 30.4*  MCV 90.9 92.7  PLT 309 228    Cardiac Enzymes: No results for input(s): CKTOTAL, CKMB, CKMBINDEX, TROPONINI in the last 168  hours.  BNP (last 3 results) No results for input(s): BNP in the last 8760 hours.  ProBNP (last 3 results) No results for input(s): PROBNP in the last 8760 hours.  Radiological Exams: DG Chest Port 1 View  Result Date: 08/30/2020 CLINICAL DATA:  Pneumothorax and chest tube EXAM: PORTABLE CHEST 1 VIEW COMPARISON:  Yesterday FINDINGS: Left chest tube in stable position. Apicolateral left pneumothorax which is small to moderate and decreased from prior. Diffuse coarse interstitial opacities. Right PICC with tip at the right subclavian to brachiocephalic level. Tracheostomy tube in place. Normal heart size. IMPRESSION: Decreased left pneumothorax with stable chest tube position. Electronically Signed   By: Marnee Spring M.D.   On: 08/30/2020 05:58   DG CHEST PORT 1 VIEW  Result Date: 08/29/2020 CLINICAL DATA:  Status post left thoracostomy placement EXAM: PORTABLE CHEST 1 VIEW COMPARISON:  08/28/2020 FINDINGS: Cardiac shadow is stable. Tracheostomy tube and right-sided PICC line are again noted and stable. PICC line catheter tip is noted in the right clavi in vein. New pigtail catheter is noted on the left with slight reduction in left pneumothorax. Increased parenchymal markings are noted within the left lung due to the collapse. Right lung remains clear. Multiple old rib fractures are noted on the left. IMPRESSION: Pigtail catheter now seen on the left with slight reduction in pneumothorax. Continued follow-up is recommended. Electronically Signed   By: Loraine Leriche  Lukens M.D.   On: 08/29/2020 09:47   CT IMAGE GUIDED DRAINAGE BY PERCUTANEOUS CATHETER  Result Date: 08/29/2020 INDICATION: Respiratory failure secondary to prior COVID-19 pneumonia and enlarging chronic left pneumothorax. EXAM: CT-GUIDED LEFT THORACOSTOMY TUBE PLACEMENT MEDICATIONS: None ANESTHESIA/SEDATION: Fentanyl 100 mcg IV; Versed 2.0 mg IV Moderate Sedation Time:  20 minutes. The patient was continuously monitored during the  procedure by the interventional radiology nurse under my direct supervision. COMPLICATIONS: None immediate. PROCEDURE: Informed written consent was obtained from the patient's wife after a thorough discussion of the procedural risks, benefits and alternatives. All questions were addressed. Maximal Sterile Barrier Technique was utilized including caps, mask, sterile gowns, sterile gloves, sterile drape, hand hygiene and skin antiseptic. A timeout was performed prior to the initiation of the procedure. CT was performed of the chest in a supine position. From an anterolateral approach, an 18 gauge trocar needle was advanced into the pleural space. After confirming needle tip position, a guidewire was advanced and the needle removed. After tract dilatation, a 12 French pigtail catheter was advanced into the pleural space. Catheter position was confirmed by CT. The catheter was attached to a [employer] device. It was secured at the skin with a Prolene retention suture and StatLock device. FINDINGS: Moderate left anterior and lateral pneumothorax present. There also is a small amount of air in the mediastinum. Both lungs demonstrate significant parenchymal opacity consistent with lung injury likely secondary to the known history of prior COVID-19 pneumonia. No pleural effusions identified. The left thoracostomy tube was placed in the anterior pleural space. The Pleur-evac will be connected to wall suction at -20 cm of water. IMPRESSION: CT-guided placement of 12 French left thoracostomy tube in the anterior pleural space. The tube was connected to a Pleur-evac which will be connected to wall suction at -20 cm of water. Electronically Signed   By: Irish Lack M.D.   On: 08/29/2020 08:11    Assessment/Plan Active Problems:   Acute on chronic respiratory failure with hypoxia (HCC)   COVID-19 virus infection   Pneumothorax   Acute respiratory distress syndrome (ARDS) due to COVID-19 virus (HCC)    Tracheostomy status (HCC)   1. Acute on chronic respiratory failure hypoxia we will continue with T collar trials patient is completing 48 hours today 2. COVID-19 virus infection in recovery we will continue to follow 3. Pneumothorax chest tube in place radiology notes appreciated  4. ARDS treated slow improvement we will continue to follow 5. Tracheostomy will remain in place for now   I have personally seen and evaluated the patient, evaluated laboratory and imaging results, formulated the assessment and plan and placed orders. The Patient requires high complexity decision making with multiple systems involvement.  Rounds were done with the Respiratory Therapy Director and Staff therapists and discussed with nursing staff also.  Yevonne Pax, MD [facility] Pulmonary Critical Care Medicine Sleep Medicine

## 2020-08-31 DIAGNOSIS — J9621 Acute and chronic respiratory failure with hypoxia: Secondary | ICD-10-CM | POA: Diagnosis not present

## 2020-08-31 DIAGNOSIS — Z930 Tracheostomy status: Secondary | ICD-10-CM | POA: Diagnosis not present

## 2020-08-31 DIAGNOSIS — U071 COVID-19: Secondary | ICD-10-CM | POA: Diagnosis not present

## 2020-08-31 DIAGNOSIS — J939 Pneumothorax, unspecified: Secondary | ICD-10-CM | POA: Diagnosis not present

## 2020-08-31 LAB — BASIC METABOLIC PANEL
Anion gap: 5 (ref 5–15)
BUN: 49 mg/dL — ABNORMAL HIGH (ref 8–23)
CO2: 17 mmol/L — ABNORMAL LOW (ref 22–32)
Calcium: 7.6 mg/dL — ABNORMAL LOW (ref 8.9–10.3)
Chloride: 122 mmol/L — ABNORMAL HIGH (ref 98–111)
Creatinine, Ser: 0.84 mg/dL (ref 0.61–1.24)
GFR, Estimated: >60 mL/min (ref >60)
Glucose, Bld: 173 mg/dL — ABNORMAL HIGH (ref 70–99)
Potassium: 4.4 mmol/L (ref 3.5–5.1)
Sodium: 144 mmol/L (ref 135–145)

## 2020-08-31 LAB — CBC
HCT: 28.4 % — ABNORMAL LOW (ref 39.0–52.0)
Hemoglobin: 9 g/dL — ABNORMAL LOW (ref 13.0–17.0)
MCH: 29.1 pg (ref 26.0–34.0)
MCHC: 31.7 g/dL (ref 30.0–36.0)
MCV: 91.9 fL (ref 80.0–100.0)
Platelets: 226 10*3/uL (ref 150–400)
RBC: 3.09 MIL/uL — ABNORMAL LOW (ref 4.22–5.81)
RDW: 17.5 % — ABNORMAL HIGH (ref 11.5–15.5)
WBC: 15.4 10*3/uL — ABNORMAL HIGH (ref 4.0–10.5)
nRBC: 0.1 % (ref 0.0–0.2)

## 2020-08-31 NOTE — Progress Notes (Signed)
Pulmonary Critical Care Medicine [facility] [place]   PULMONARY CRITICAL CARE SERVICE  PROGRESS NOTE  Date of Service: 08/31/2020  Brent Farmer  NGE:[id number]  DOB: Nov 11, 1950   DOA: 08/22/2020  Referring Physician: Carron Curie, MD  HPI: Brent Farmer is a 69 y.o. male seen for follow up of Acute on Chronic Respiratory Failure.  Patient currently is on T collar has been on 35% FiO2 good saturations are noted at this time.  Medications: Reviewed on Rounds  Physical Exam:  Vitals: Temperature is 97.1 pulse 96 respiratory rate 19 blood pressure is 114/70 saturations 98%  Ventilator Settings on T collar with an FiO2 35%  . General: Comfortable at this time . Eyes: Grossly normal lids, irises & conjunctiva . ENT: grossly tongue is normal . Neck: no obvious mass . Cardiovascular: S1 S2 normal no gallop . Respiratory: No rhonchi rales noted at this time . Abdomen: soft . Skin: no rash seen on limited exam . Musculoskeletal: not rigid . Psychiatric:unable to assess . Neurologic: no seizure no involuntary movements         Lab Data:   Basic Metabolic Panel: Recent Labs  Lab 08/25/20 1109 08/27/20 0643 08/29/20 0440 08/31/20 0635  NA 148* 148* 151* 144  K 3.7 4.6 4.5 4.4  CL 116* 121* 125* 122*  CO2 21* 17* 17* 17*  GLUCOSE 294* 191* 102* 173*  BUN 84* 61* 72* 49*  CREATININE 1.35* 0.96 1.00 0.84  CALCIUM 8.0* 8.2* 8.1* 7.6*    ABG: Recent Labs  Lab 08/29/20 0840  PHART 7.453*  PCO2ART 23.4*  PO2ART 58.8*  HCO3 16.2*  O2SAT 90.5    Liver Function Tests: No results for input(s): AST, ALT, ALKPHOS, BILITOT, PROT, ALBUMIN in the last 168 hours. No results for input(s): LIPASE, AMYLASE in the last 168 hours. No results for input(s): AMMONIA in the last 168 hours.  CBC: Recent Labs  Lab 08/27/20 0643 08/29/20 0440 08/31/20 0635  WBC 20.7* 16.1* 15.4*  HGB 9.9* 9.6* 9.0*  HCT 31.1* 30.4* 28.4*  MCV 90.9 92.7 91.9  PLT 309 228 226     Cardiac Enzymes: No results for input(s): CKTOTAL, CKMB, CKMBINDEX, TROPONINI in the last 168 hours.  BNP (last 3 results) No results for input(s): BNP in the last 8760 hours.  ProBNP (last 3 results) No results for input(s): PROBNP in the last 8760 hours.  Radiological Exams: DG Chest Port 1 View  Result Date: 08/30/2020 CLINICAL DATA:  Pneumothorax and chest tube EXAM: PORTABLE CHEST 1 VIEW COMPARISON:  Yesterday FINDINGS: Left chest tube in stable position. Apicolateral left pneumothorax which is small to moderate and decreased from prior. Diffuse coarse interstitial opacities. Right PICC with tip at the right subclavian to brachiocephalic level. Tracheostomy tube in place. Normal heart size. IMPRESSION: Decreased left pneumothorax with stable chest tube position. Electronically Signed   By: Marnee Spring M.D.   On: 08/30/2020 05:58    Assessment/Plan Active Problems:   Acute on chronic respiratory failure with hypoxia (HCC)   COVID-19 virus infection   Pneumothorax   Acute respiratory distress syndrome (ARDS) due to COVID-19 virus (HCC)   Tracheostomy status (HCC)   1. Acute on chronic respiratory failure with oxygen continue T-piece titrate oxygen continue pulmonary toilet. 2. COVID-19 virus infection in recovery we will continue with supportive care 3. Pneumothorax no change in fact appears to be improving slowly 4. ARDS treated we will continue to follow 5. Tracheostomy remains in place for now   I have personally  seen and evaluated the patient, evaluated laboratory and imaging results, formulated the assessment and plan and placed orders. The Patient requires high complexity decision making with multiple systems involvement.  Rounds were done with the Respiratory Therapy Director and Staff therapists and discussed with nursing staff also.  Allyne Gee, MD [facility] Pulmonary Critical Care Medicine Sleep Medicine

## 2020-09-01 ENCOUNTER — Other Ambulatory Visit (HOSPITAL_COMMUNITY): Payer: Medicare Other

## 2020-09-01 DIAGNOSIS — U071 COVID-19: Secondary | ICD-10-CM | POA: Diagnosis not present

## 2020-09-01 DIAGNOSIS — Z930 Tracheostomy status: Secondary | ICD-10-CM | POA: Diagnosis not present

## 2020-09-01 DIAGNOSIS — J939 Pneumothorax, unspecified: Secondary | ICD-10-CM | POA: Diagnosis not present

## 2020-09-01 DIAGNOSIS — J9621 Acute and chronic respiratory failure with hypoxia: Secondary | ICD-10-CM | POA: Diagnosis not present

## 2020-09-01 LAB — CULTURE, BLOOD (ROUTINE X 2)
Culture: NO GROWTH
Culture: NO GROWTH
Special Requests: ADEQUATE

## 2020-09-01 NOTE — Progress Notes (Signed)
Pulmonary Critical Care Medicine [facility] [place]   PULMONARY CRITICAL CARE SERVICE  PROGRESS NOTE  Date of Service: 09/01/2020  Brent Farmer  BOF:[id number]  DOB: Dec 26, 1950   DOA: 08/22/2020  Referring Physician: Carron Curie, MD  HPI: Brent Farmer is a 69 y.o. male seen for follow up of Acute on Chronic Respiratory Failure. Patient is on T collar currently on 35% FiO2 good saturations are noted at this time.  Medications: Reviewed on Rounds  Physical Exam:  Vitals: Temperature is 98.2 pulse 76 respiratory 18 blood pressure is 106/52 saturations 99%  Ventilator Settings off the ventilator on T collar FiO2 35%  . General: Comfortable at this time . Eyes: Grossly normal lids, irises & conjunctiva . ENT: grossly tongue is normal . Neck: no obvious mass . Cardiovascular: S1 S2 normal no gallop . Respiratory: No rhonchi no rales are noted at this time . Abdomen: soft . Skin: no rash seen on limited exam . Musculoskeletal: not rigid . Psychiatric:unable to assess . Neurologic: no seizure no involuntary movements         Lab Data:   Basic Metabolic Panel: Recent Labs  Lab 08/25/20 1109 08/27/20 0643 08/29/20 0440 08/31/20 0635  NA 148* 148* 151* 144  K 3.7 4.6 4.5 4.4  CL 116* 121* 125* 122*  CO2 21* 17* 17* 17*  GLUCOSE 294* 191* 102* 173*  BUN 84* 61* 72* 49*  CREATININE 1.35* 0.96 1.00 0.84  CALCIUM 8.0* 8.2* 8.1* 7.6*    ABG: Recent Labs  Lab 08/29/20 0840  PHART 7.453*  PCO2ART 23.4*  PO2ART 58.8*  HCO3 16.2*  O2SAT 90.5    Liver Function Tests: No results for input(s): AST, ALT, ALKPHOS, BILITOT, PROT, ALBUMIN in the last 168 hours. No results for input(s): LIPASE, AMYLASE in the last 168 hours. No results for input(s): AMMONIA in the last 168 hours.  CBC: Recent Labs  Lab 08/27/20 0643 08/29/20 0440 08/31/20 0635  WBC 20.7* 16.1* 15.4*  HGB 9.9* 9.6* 9.0*  HCT 31.1* 30.4* 28.4*  MCV 90.9 92.7 91.9  PLT 309 228 226     Cardiac Enzymes: No results for input(s): CKTOTAL, CKMB, CKMBINDEX, TROPONINI in the last 168 hours.  BNP (last 3 results) No results for input(s): BNP in the last 8760 hours.  ProBNP (last 3 results) No results for input(s): PROBNP in the last 8760 hours.  Radiological Exams: No results found.  Assessment/Plan Active Problems:   Acute on chronic respiratory failure with hypoxia (HCC)   COVID-19 virus infection   Pneumothorax   Acute respiratory distress syndrome (ARDS) due to COVID-19 virus (HCC)   Tracheostomy status (HCC)   1. Acute on chronic respiratory failure with hypoxia we will continue with T-piece titrate oxygen continue to monitor. 2. COVID-19 virus infection continue continue to follow 3. Pneumothorax stable 4. ARDS slow improvement 5. Tracheostomy remains in place   I have personally seen and evaluated the patient, evaluated laboratory and imaging results, formulated the assessment and plan and placed orders. The Patient requires high complexity decision making with multiple systems involvement.  Rounds were done with the Respiratory Therapy Director and Staff therapists and discussed with nursing staff also.  Yevonne Pax, MD [facility] Pulmonary Critical Care Medicine Sleep Medicine

## 2020-09-02 ENCOUNTER — Other Ambulatory Visit (HOSPITAL_COMMUNITY): Payer: Medicare Other

## 2020-09-02 DIAGNOSIS — J939 Pneumothorax, unspecified: Secondary | ICD-10-CM | POA: Diagnosis not present

## 2020-09-02 DIAGNOSIS — J9621 Acute and chronic respiratory failure with hypoxia: Secondary | ICD-10-CM | POA: Diagnosis not present

## 2020-09-02 DIAGNOSIS — Z930 Tracheostomy status: Secondary | ICD-10-CM | POA: Diagnosis not present

## 2020-09-02 DIAGNOSIS — U071 COVID-19: Secondary | ICD-10-CM | POA: Diagnosis not present

## 2020-09-02 LAB — CULTURE, RESPIRATORY W GRAM STAIN

## 2020-09-02 NOTE — Progress Notes (Signed)
Pulmonary Critical Care Medicine [facility] [place]   PULMONARY CRITICAL CARE SERVICE  PROGRESS NOTE  Date of Service: 09/02/2020  Navraj Dreibelbis  QMV:[id number]  DOB: [birth year]   DOA: 08/22/2020  Referring Physician: Carron Curie, MD  HPI: Dartanion Teo is a 69 y.o. male seen for follow up of Acute on Chronic Respiratory Failure.  Patient remains off ventilator on T collar currently on 45% FiO2 has been doing well should be able to do the PMV.  Medications: Reviewed on Rounds  Physical Exam:  Vitals: Temperature is 97.6 pulse 100 respiratory 18 blood pressure is 117/67 saturations 100%  Ventilator Settings on T collar with an FiO2 45%   General: Comfortable at this time  Eyes: Grossly normal lids, irises & conjunctiva  ENT: grossly tongue is normal  Neck: no obvious mass  Cardiovascular: S1 S2 normal no gallop  Respiratory: No rhonchi no rales noted at this time  Abdomen: soft  Skin: no rash seen on limited exam  Musculoskeletal: not rigid  Psychiatric:unable to assess  Neurologic: no seizure no involuntary movements         Lab Data:   Basic Metabolic Panel: Recent Labs  Lab 08/27/20 0643 08/29/20 0440 08/31/20 0635  NA 148* 151* 144  K 4.6 4.5 4.4  CL 121* 125* 122*  CO2 17* 17* 17*  GLUCOSE 191* 102* 173*  BUN 61* 72* 49*  CREATININE 0.96 1.00 0.84  CALCIUM 8.2* 8.1* 7.6*    ABG: Recent Labs  Lab 08/29/20 0840  PHART 7.453*  PCO2ART 23.4*  PO2ART 58.8*  HCO3 16.2*  O2SAT 90.5    Liver Function Tests: No results for input(s): AST, ALT, ALKPHOS, BILITOT, PROT, ALBUMIN in the last 168 hours. No results for input(s): LIPASE, AMYLASE in the last 168 hours. No results for input(s): AMMONIA in the last 168 hours.  CBC: Recent Labs  Lab 08/27/20 0643 08/29/20 0440 08/31/20 0635  WBC 20.7* 16.1* 15.4*  HGB 9.9* 9.6* 9.0*  HCT 31.1* 30.4* 28.4*  MCV 90.9 92.7 91.9  PLT 309 228 226    Cardiac Enzymes: No results  for input(s): CKTOTAL, CKMB, CKMBINDEX, TROPONINI in the last 168 hours.  BNP (last 3 results) No results for input(s): BNP in the last 8760 hours.  ProBNP (last 3 results) No results for input(s): PROBNP in the last 8760 hours.  Radiological Exams: DG CHEST PORT 1 VIEW  Result Date: 09/01/2020 CLINICAL DATA:  Chest tube placement for pneumothorax EXAM: PORTABLE CHEST 1 VIEW COMPARISON:  August 30, 2020 FINDINGS: Chest tube present on the right somewhat inferiorly. Persistent pneumothorax on the left, stable, without tension component. There is atelectatic change in the right lower lung region as well as throughout the left lung with atelectasis on the left contributing by the pneumothorax. No consolidation. Heart is upper normal in size with pulmonary vascularity normal. No adenopathy. Tracheostomy catheter tip is 2.8 cm above the carina. Total shoulder replacement on the left. Central catheter tip at junction of right innominate vein and superior vena cava. IMPRESSION: Persistent pneumothorax on the left without tension component. Pneumothorax similar to previous study allowing for some difference in positioning. Tube and catheter positions as described. Areas of atelectasis bilaterally, more on the left than on the right. No consolidation. Stable cardiac silhouette. Electronically Signed   By: Bretta Bang III M.D.   On: 09/01/2020 08:30    Assessment/Plan Active Problems:   Acute on chronic respiratory failure with hypoxia (HCC)   COVID-19 virus infection   Pneumothorax  Acute respiratory distress syndrome (ARDS) due to COVID-19 virus (HCC)   Tracheostomy status (HCC)   1. Acute on chronic respiratory failure hypoxia we will continue with the T collar.  Patient's been doing well able to speak without any difficulty 2. COVID-19 virus infection in recovery we will continue to follow 3. Pneumothorax resolved 4. ARDS treated slow improvement 5. Tracheostomy doing well with  weaning   I have personally seen and evaluated the patient, evaluated laboratory and imaging results, formulated the assessment and plan and placed orders. The Patient requires high complexity decision making with multiple systems involvement.  Rounds were done with the Respiratory Therapy Director and Staff therapists and discussed with nursing staff also.  Yevonne Pax, MD [facility] Pulmonary Critical Care Medicine Sleep Medicine

## 2020-09-02 NOTE — Progress Notes (Signed)
Referring Physician(s): Dr Manson Passey  Supervising Physician: Dr Marliss Coots  Patient Status:  [facility]  Chief Complaint:  Left PTX Chest tube placed in IR 11/10  Subjective:  Better today CT intact 50 cc serous fluid in vac No air leak CXR yesterday:  IMPRESSION: Persistent pneumothorax on the left without tension component. Pneumothorax similar to previous study allowing for some difference in positioning. Tube and catheter positions as described. Areas of atelectasis bilaterally, more on the left than on the right. No consolidation. Stable cardiac silhouette.   Allergies: Patient has no allergy information on record.  Medications: Prior to Admission medications   Not on File     Vital Signs: BP (!) 93/51   Pulse 76   Resp 16   SpO2 98%   Physical Exam Skin:    General: Skin is warm.     Comments: Site is clean and dry NT no bleeding  No air leak 50 cc serous fluid on vac     Imaging: DG CHEST PORT 1 VIEW  Result Date: 09/01/2020 CLINICAL DATA:  Chest tube placement for pneumothorax EXAM: PORTABLE CHEST 1 VIEW COMPARISON:  August 30, 2020 FINDINGS: Chest tube present on the right somewhat inferiorly. Persistent pneumothorax on the left, stable, without tension component. There is atelectatic change in the right lower lung region as well as throughout the left lung with atelectasis on the left contributing by the pneumothorax. No consolidation. Heart is upper normal in size with pulmonary vascularity normal. No adenopathy. Tracheostomy catheter tip is 2.8 cm above the carina. Total shoulder replacement on the left. Central catheter tip at junction of right innominate vein and superior vena cava. IMPRESSION: Persistent pneumothorax on the left without tension component. Pneumothorax similar to previous study allowing for some difference in positioning. Tube and catheter positions as described. Areas of atelectasis bilaterally, more on the left than on the  right. No consolidation. Stable cardiac silhouette. Electronically Signed   By: Bretta Bang III M.D.   On: 09/01/2020 08:30   DG Chest Port 1 View  Result Date: 08/30/2020 CLINICAL DATA:  Pneumothorax and chest tube EXAM: PORTABLE CHEST 1 VIEW COMPARISON:  Yesterday FINDINGS: Left chest tube in stable position. Apicolateral left pneumothorax which is small to moderate and decreased from prior. Diffuse coarse interstitial opacities. Right PICC with tip at the right subclavian to brachiocephalic level. Tracheostomy tube in place. Normal heart size. IMPRESSION: Decreased left pneumothorax with stable chest tube position. Electronically Signed   By: Marnee Spring M.D.   On: 08/30/2020 05:58    Labs:  CBC: Recent Labs    08/23/20 0223 08/27/20 0643 08/29/20 0440 08/31/20 0635  WBC 16.9* 20.7* 16.1* 15.4*  HGB 10.9* 9.9* 9.6* 9.0*  HCT 33.8* 31.1* 30.4* 28.4*  PLT 323 309 228 226    COAGS: Recent Labs    08/23/20 0223  INR 1.2    BMP: Recent Labs    08/25/20 1109 08/27/20 0643 08/29/20 0440 08/31/20 0635  NA 148* 148* 151* 144  K 3.7 4.6 4.5 4.4  CL 116* 121* 125* 122*  CO2 21* 17* 17* 17*  GLUCOSE 294* 191* 102* 173*  BUN 84* 61* 72* 49*  CALCIUM 8.0* 8.2* 8.1* 7.6*  CREATININE 1.35* 0.96 1.00 0.84  GFRNONAA 58* >60 >60 >60    LIVER FUNCTION TESTS: Recent Labs    08/23/20 0223  BILITOT 0.6  AST 19  ALT 21  ALKPHOS 73  PROT 6.1*  ALBUMIN 2.3*    Assessment and Plan:  Left CT intact Will follow with [facility]  Electronically Signed: Robet Leu, PA-C 09/02/2020, 9:56 AM   I spent a total of 15 Minutes at the the patient's bedside AND on the patient's hospital floor or unit, greater than 50% of which was counseling/coordinating care for Left CT

## 2020-09-03 ENCOUNTER — Other Ambulatory Visit (HOSPITAL_COMMUNITY): Payer: Medicare Other

## 2020-09-03 DIAGNOSIS — J939 Pneumothorax, unspecified: Secondary | ICD-10-CM | POA: Diagnosis not present

## 2020-09-03 DIAGNOSIS — J9621 Acute and chronic respiratory failure with hypoxia: Secondary | ICD-10-CM | POA: Diagnosis not present

## 2020-09-03 DIAGNOSIS — Z930 Tracheostomy status: Secondary | ICD-10-CM | POA: Diagnosis not present

## 2020-09-03 DIAGNOSIS — U071 COVID-19: Secondary | ICD-10-CM | POA: Diagnosis not present

## 2020-09-03 NOTE — Progress Notes (Signed)
Pulmonary Critical Care Medicine [facility] [place]   PULMONARY CRITICAL CARE SERVICE  PROGRESS NOTE  Date of Service: 09/03/2020  Brent Farmer  QJJ:[id number]  DOB: April 18, 1951   DOA: 08/22/2020  Referring Physician: Carron Curie, MD  HPI: Brent Farmer is a 69 y.o. male seen for follow up of Acute on Chronic Respiratory Failure.  Patient currently is on T collar has been on 30% FiO2 using PMV appears to be doing well.  Chest tubes are in place  Medications: Reviewed on Rounds  Physical Exam:  Vitals: Temperature is 97.9 pulse 94 respiratory rate 21 blood pressure is 125/68 saturations 95%  Ventilator Settings currently is on no T collar with an FiO2 of 30% using PMV  . General: Comfortable at this time . Eyes: Grossly normal lids, irises & conjunctiva . ENT: grossly tongue is normal . Neck: no obvious mass . Cardiovascular: S1 S2 normal no gallop . Respiratory: No rhonchi no rales are noted at this time . Abdomen: soft . Skin: no rash seen on limited exam . Musculoskeletal: not rigid . Psychiatric:unable to assess . Neurologic: no seizure no involuntary movements         Lab Data:   Basic Metabolic Panel: Recent Labs  Lab 08/29/20 0440 08/31/20 0635  NA 151* 144  K 4.5 4.4  CL 125* 122*  CO2 17* 17*  GLUCOSE 102* 173*  BUN 72* 49*  CREATININE 1.00 0.84  CALCIUM 8.1* 7.6*    ABG: Recent Labs  Lab 08/29/20 0840  PHART 7.453*  PCO2ART 23.4*  PO2ART 58.8*  HCO3 16.2*  O2SAT 90.5    Liver Function Tests: No results for input(s): AST, ALT, ALKPHOS, BILITOT, PROT, ALBUMIN in the last 168 hours. No results for input(s): LIPASE, AMYLASE in the last 168 hours. No results for input(s): AMMONIA in the last 168 hours.  CBC: Recent Labs  Lab 08/29/20 0440 08/31/20 0635  WBC 16.1* 15.4*  HGB 9.6* 9.0*  HCT 30.4* 28.4*  MCV 92.7 91.9  PLT 228 226    Cardiac Enzymes: No results for input(s): CKTOTAL, CKMB, CKMBINDEX, TROPONINI in the  last 168 hours.  BNP (last 3 results) No results for input(s): BNP in the last 8760 hours.  ProBNP (last 3 results) No results for input(s): PROBNP in the last 8760 hours.  Radiological Exams: DG Abd 1 View  Result Date: 09/02/2020 CLINICAL DATA:  Abdominal pain. EXAM: ABDOMEN - 1 VIEW COMPARISON:  08/22/2020 FINDINGS: Gastrostomy tube is present. Gas in the colon. Nonobstructive bowel gas pattern. Pelvic calcifications are suggestive for phleboliths. Possible stone in the right kidney lower pole. Probable small stone in the mid left kidney. IMPRESSION: 1. Nonobstructive bowel gas pattern. 2. Probable kidney stones. Electronically Signed   By: Richarda Overlie M.D.   On: 09/02/2020 11:44   DG CHEST PORT 1 VIEW  Result Date: 09/03/2020 CLINICAL DATA:  Chest tube surveillance EXAM: PORTABLE CHEST 1 VIEW COMPARISON:  09/01/2020 FINDINGS: Left basilar pigtail pleural drainage catheter remains in place. Right PICC line is unchanged with distal tip terminating approximately the brachiocephalic level. Stable heart size. No shift of the heart or mediastinal structures. Diffusely increased interstitial opacities bilaterally. No discernible pneumothorax. Multiple left-sided rib fractures. Reverse left shoulder arthroplasty. IMPRESSION: 1. Stable positioning of left-sided chest tube without discernible pneumothorax. 2. Bilateral interstitial opacities, worsened from prior. Electronically Signed   By: Duanne Guess D.O.   On: 09/03/2020 10:17    Assessment/Plan Active Problems:   Acute on chronic respiratory failure with hypoxia (  HCC)   COVID-19 virus infection   Pneumothorax   Acute respiratory distress syndrome (ARDS) due to COVID-19 virus (HCC)   Tracheostomy status (HCC)   1. Acute on chronic respiratory failure hypoxia we will continue with T-piece titrate oxygen continue pulmonary toilet. 2. COVID-19 virus infection in recovery we will continue to follow along 3. Pneumothorax treated and  resolving 4. ARDS treated improving 5. Tracheostomy remains in place   I have personally seen and evaluated the patient, evaluated laboratory and imaging results, formulated the assessment and plan and placed orders. The Patient requires high complexity decision making with multiple systems involvement.  Rounds were done with the Respiratory Therapy Director and Staff therapists and discussed with nursing staff also.  Yevonne Pax, MD [facility] Pulmonary Critical Care Medicine Sleep Medicine

## 2020-09-04 DIAGNOSIS — U071 COVID-19: Secondary | ICD-10-CM | POA: Diagnosis not present

## 2020-09-04 DIAGNOSIS — J9621 Acute and chronic respiratory failure with hypoxia: Secondary | ICD-10-CM | POA: Diagnosis not present

## 2020-09-04 DIAGNOSIS — Z930 Tracheostomy status: Secondary | ICD-10-CM | POA: Diagnosis not present

## 2020-09-04 DIAGNOSIS — J939 Pneumothorax, unspecified: Secondary | ICD-10-CM | POA: Diagnosis not present

## 2020-09-04 LAB — BASIC METABOLIC PANEL
Anion gap: 5 (ref 5–15)
BUN: 33 mg/dL — ABNORMAL HIGH (ref 8–23)
CO2: 21 mmol/L — ABNORMAL LOW (ref 22–32)
Calcium: 8 mg/dL — ABNORMAL LOW (ref 8.9–10.3)
Chloride: 122 mmol/L — ABNORMAL HIGH (ref 98–111)
Creatinine, Ser: 0.64 mg/dL (ref 0.61–1.24)
GFR, Estimated: >60 mL/min (ref >60)
Glucose, Bld: 62 mg/dL — ABNORMAL LOW (ref 70–99)
Potassium: 3.8 mmol/L (ref 3.5–5.1)
Sodium: 148 mmol/L — ABNORMAL HIGH (ref 135–145)

## 2020-09-04 LAB — CBC
HCT: 29.3 % — ABNORMAL LOW (ref 39.0–52.0)
Hemoglobin: 9.6 g/dL — ABNORMAL LOW (ref 13.0–17.0)
MCH: 30.6 pg (ref 26.0–34.0)
MCHC: 32.8 g/dL (ref 30.0–36.0)
MCV: 93.3 fL (ref 80.0–100.0)
Platelets: 252 10*3/uL (ref 150–400)
RBC: 3.14 MIL/uL — ABNORMAL LOW (ref 4.22–5.81)
RDW: 20.2 % — ABNORMAL HIGH (ref 11.5–15.5)
WBC: 19.8 10*3/uL — ABNORMAL HIGH (ref 4.0–10.5)
nRBC: 0.2 % (ref 0.0–0.2)

## 2020-09-04 NOTE — Progress Notes (Signed)
Pulmonary Critical Care Medicine [facility] [place]   PULMONARY CRITICAL CARE SERVICE  PROGRESS NOTE  Date of Service: 09/04/2020  Brent Farmer  NOM:[id number]  DOB: 1950/11/29   DOA: 08/22/2020  Referring Physician: Carron Curie, MD  HPI: Brent Farmer is a 69 y.o. male seen for follow up of Acute on Chronic Respiratory Failure.  Patient currently is on T-piece has been on 35% FiO2.  Patient's been tolerating PMV fairly well  Medications: Reviewed on Rounds  Physical Exam:  Vitals: Temperature 98.4 pulse 102 respiratory rate 14 blood pressure is 160/70 saturations 96%  Ventilator Settings off the ventilator on T collar with an FiO2 35%  . General: Comfortable at this time . Eyes: Grossly normal lids, irises & conjunctiva . ENT: grossly tongue is normal . Neck: no obvious mass . Cardiovascular: S1 S2 normal no gallop . Respiratory: No rhonchi no rales are noted at this time . Abdomen: soft . Skin: no rash seen on limited exam . Musculoskeletal: not rigid . Psychiatric:unable to assess . Neurologic: no seizure no involuntary movements         Lab Data:   Basic Metabolic Panel: Recent Labs  Lab 08/29/20 0440 08/31/20 0635 09/04/20 0431  NA 151* 144 148*  K 4.5 4.4 3.8  CL 125* 122* 122*  CO2 17* 17* 21*  GLUCOSE 102* 173* 62*  BUN 72* 49* 33*  CREATININE 1.00 0.84 0.64  CALCIUM 8.1* 7.6* 8.0*    ABG: Recent Labs  Lab 08/29/20 0840  PHART 7.453*  PCO2ART 23.4*  PO2ART 58.8*  HCO3 16.2*  O2SAT 90.5    Liver Function Tests: No results for input(s): AST, ALT, ALKPHOS, BILITOT, PROT, ALBUMIN in the last 168 hours. No results for input(s): LIPASE, AMYLASE in the last 168 hours. No results for input(s): AMMONIA in the last 168 hours.  CBC: Recent Labs  Lab 08/29/20 0440 08/31/20 0635 09/04/20 0431  WBC 16.1* 15.4* 19.8*  HGB 9.6* 9.0* 9.6*  HCT 30.4* 28.4* 29.3*  MCV 92.7 91.9 93.3  PLT 228 226 252    Cardiac Enzymes: No  results for input(s): CKTOTAL, CKMB, CKMBINDEX, TROPONINI in the last 168 hours.  BNP (last 3 results) No results for input(s): BNP in the last 8760 hours.  ProBNP (last 3 results) No results for input(s): PROBNP in the last 8760 hours.  Radiological Exams: DG CHEST PORT 1 VIEW  Result Date: 09/03/2020 CLINICAL DATA:  Chest tube surveillance EXAM: PORTABLE CHEST 1 VIEW COMPARISON:  09/01/2020 FINDINGS: Left basilar pigtail pleural drainage catheter remains in place. Right PICC line is unchanged with distal tip terminating approximately the brachiocephalic level. Stable heart size. No shift of the heart or mediastinal structures. Diffusely increased interstitial opacities bilaterally. No discernible pneumothorax. Multiple left-sided rib fractures. Reverse left shoulder arthroplasty. IMPRESSION: 1. Stable positioning of left-sided chest tube without discernible pneumothorax. 2. Bilateral interstitial opacities, worsened from prior. Electronically Signed   By: Duanne Guess D.O.   On: 09/03/2020 10:17    Assessment/Plan Active Problems:   Acute on chronic respiratory failure with hypoxia (HCC)   COVID-19 virus infection   Pneumothorax   Acute respiratory distress syndrome (ARDS) due to COVID-19 virus (HCC)   Tracheostomy status (HCC)   1. Acute on chronic respiratory failure hypoxia we'll continue T-piece weaning on PMV trials as tolerated. 2. COVID-19 virus infection in recovery we'll continue with supportive care 3. Pneumothorax resolved 4. Acute respiratory distress no change 5. Tracheostomy remains in place   I have personally seen and  evaluated the patient, evaluated laboratory and imaging results, formulated the assessment and plan and placed orders. The Patient requires high complexity decision making with multiple systems involvement.  Rounds were done with the Respiratory Therapy Director and Staff therapists and discussed with nursing staff also.  Yevonne Pax, MD  [facility] Pulmonary Critical Care Medicine Sleep Medicine

## 2020-09-05 DIAGNOSIS — J9621 Acute and chronic respiratory failure with hypoxia: Secondary | ICD-10-CM | POA: Diagnosis not present

## 2020-09-05 DIAGNOSIS — Z930 Tracheostomy status: Secondary | ICD-10-CM | POA: Diagnosis not present

## 2020-09-05 DIAGNOSIS — J939 Pneumothorax, unspecified: Secondary | ICD-10-CM | POA: Diagnosis not present

## 2020-09-05 DIAGNOSIS — U071 COVID-19: Secondary | ICD-10-CM | POA: Diagnosis not present

## 2020-09-05 LAB — BLOOD CULTURE ID PANEL (REFLEXED) - BCID2

## 2020-09-05 LAB — URINALYSIS, ROUTINE W REFLEX MICROSCOPIC
Bilirubin Urine: NEGATIVE
Glucose, UA: 50 mg/dL — AB
Hgb urine dipstick: NEGATIVE
Ketones, ur: NEGATIVE mg/dL
Leukocytes,Ua: NEGATIVE
Nitrite: NEGATIVE
Protein, ur: NEGATIVE mg/dL
Specific Gravity, Urine: 1.015 (ref 1.005–1.030)
pH: 7 (ref 5.0–8.0)

## 2020-09-05 NOTE — Progress Notes (Signed)
Pulmonary Critical Care Medicine [facility] [place]   PULMONARY CRITICAL CARE SERVICE  PROGRESS NOTE  Date of Service: 09/05/2020  Jamel Holzmann  SJG:[id number]  DOB: 01-15-1951   DOA: 08/22/2020  Referring Physician: Carron Curie, MD  HPI: Brent Farmer is a 69 y.o. male seen for follow up of Acute on Chronic Respiratory Failure.  On T collar currently 40% FiO2  Medications: Reviewed on Rounds  Physical Exam:  Vitals: Temperature is 97.9 pulse 83 respiratory 15 blood pressure is 135/71 saturations 96%  Ventilator Settings on T collar with an FiO2 of 40%  . General: Comfortable at this time . Eyes: Grossly normal lids, irises & conjunctiva . ENT: grossly tongue is normal . Neck: no obvious mass . Cardiovascular: S1 S2 normal no gallop . Respiratory: No rhonchi no rales noted at this time . Abdomen: soft . Skin: no rash seen on limited exam . Musculoskeletal: not rigid . Psychiatric:unable to assess . Neurologic: no seizure no involuntary movements         Lab Data:   Basic Metabolic Panel: Recent Labs  Lab 08/31/20 0635 09/04/20 0431  NA 144 148*  K 4.4 3.8  CL 122* 122*  CO2 17* 21*  GLUCOSE 173* 62*  BUN 49* 33*  CREATININE 0.84 0.64  CALCIUM 7.6* 8.0*    ABG: Recent Labs  Lab 08/29/20 0840  PHART 7.453*  PCO2ART 23.4*  PO2ART 58.8*  HCO3 16.2*  O2SAT 90.5    Liver Function Tests: No results for input(s): AST, ALT, ALKPHOS, BILITOT, PROT, ALBUMIN in the last 168 hours. No results for input(s): LIPASE, AMYLASE in the last 168 hours. No results for input(s): AMMONIA in the last 168 hours.  CBC: Recent Labs  Lab 08/31/20 0635 09/04/20 0431  WBC 15.4* 19.8*  HGB 9.0* 9.6*  HCT 28.4* 29.3*  MCV 91.9 93.3  PLT 226 252    Cardiac Enzymes: No results for input(s): CKTOTAL, CKMB, CKMBINDEX, TROPONINI in the last 168 hours.  BNP (last 3 results) No results for input(s): BNP in the last 8760 hours.  ProBNP (last 3  results) No results for input(s): PROBNP in the last 8760 hours.  Radiological Exams: DG CHEST PORT 1 VIEW  Result Date: 09/03/2020 CLINICAL DATA:  Chest tube surveillance EXAM: PORTABLE CHEST 1 VIEW COMPARISON:  09/01/2020 FINDINGS: Left basilar pigtail pleural drainage catheter remains in place. Right PICC line is unchanged with distal tip terminating approximately the brachiocephalic level. Stable heart size. No shift of the heart or mediastinal structures. Diffusely increased interstitial opacities bilaterally. No discernible pneumothorax. Multiple left-sided rib fractures. Reverse left shoulder arthroplasty. IMPRESSION: 1. Stable positioning of left-sided chest tube without discernible pneumothorax. 2. Bilateral interstitial opacities, worsened from prior. Electronically Signed   By: Duanne Guess D.O.   On: 09/03/2020 10:17    Assessment/Plan Active Problems:   Acute on chronic respiratory failure with hypoxia (HCC)   COVID-19 virus infection   Pneumothorax   Acute respiratory distress syndrome (ARDS) due to COVID-19 virus (HCC)   Tracheostomy status (HCC)   1. Acute on chronic respiratory failure hypoxia we will continue with T-piece titrate oxygen continue pulmonary toilet. 2. COVID-19 virus infection in recovery we will continue with supportive care 3. Pneumothorax chest tube in place no active pneumothorax 4. ARDS treated we will continue to follow 5. Tracheostomy will be downsized to a #6 trach   I have personally seen and evaluated the patient, evaluated laboratory and imaging results, formulated the assessment and plan and placed orders. The  Patient requires high complexity decision making with multiple systems involvement.  Rounds were done with the Respiratory Therapy Director and Staff therapists and discussed with nursing staff also.  Allyne Gee, MD [facility] Pulmonary Critical Care Medicine Sleep Medicine

## 2020-09-06 ENCOUNTER — Other Ambulatory Visit (HOSPITAL_COMMUNITY): Payer: Medicare Other

## 2020-09-06 DIAGNOSIS — Z930 Tracheostomy status: Secondary | ICD-10-CM | POA: Diagnosis not present

## 2020-09-06 DIAGNOSIS — J9621 Acute and chronic respiratory failure with hypoxia: Secondary | ICD-10-CM | POA: Diagnosis not present

## 2020-09-06 DIAGNOSIS — J939 Pneumothorax, unspecified: Secondary | ICD-10-CM | POA: Diagnosis not present

## 2020-09-06 DIAGNOSIS — U071 COVID-19: Secondary | ICD-10-CM | POA: Diagnosis not present

## 2020-09-06 LAB — BASIC METABOLIC PANEL
Anion gap: 6 (ref 5–15)
BUN: 29 mg/dL — ABNORMAL HIGH (ref 8–23)
CO2: 21 mmol/L — ABNORMAL LOW (ref 22–32)
Calcium: 7.5 mg/dL — ABNORMAL LOW (ref 8.9–10.3)
Chloride: 112 mmol/L — ABNORMAL HIGH (ref 98–111)
Creatinine, Ser: 0.52 mg/dL — ABNORMAL LOW (ref 0.61–1.24)
GFR, Estimated: >60 mL/min (ref >60)
Glucose, Bld: 104 mg/dL — ABNORMAL HIGH (ref 70–99)
Potassium: 3.9 mmol/L (ref 3.5–5.1)
Sodium: 139 mmol/L (ref 135–145)

## 2020-09-06 LAB — CBC
HCT: 25.2 % — ABNORMAL LOW (ref 39.0–52.0)
Hemoglobin: 8 g/dL — ABNORMAL LOW (ref 13.0–17.0)
MCH: 29.7 pg (ref 26.0–34.0)
MCHC: 31.7 g/dL (ref 30.0–36.0)
MCV: 93.7 fL (ref 80.0–100.0)
Platelets: 141 10*3/uL — ABNORMAL LOW (ref 150–400)
RBC: 2.69 MIL/uL — ABNORMAL LOW (ref 4.22–5.81)
RDW: 21.3 % — ABNORMAL HIGH (ref 11.5–15.5)
WBC: 12.4 10*3/uL — ABNORMAL HIGH (ref 4.0–10.5)
nRBC: 0.2 % (ref 0.0–0.2)

## 2020-09-06 NOTE — Progress Notes (Signed)
Pulmonary Critical Care Medicine [facility] [place]   PULMONARY CRITICAL CARE SERVICE  PROGRESS NOTE  Date of Service: 09/06/2020  Brent Farmer  [id number]:[id number]  DOB: 1951-04-14   DOA: 08/22/2020  Referring Physician: Carron Curie, MD  HPI: Brent Farmer is a 69 y.o. male seen for follow up of Acute on Chronic Respiratory Failure.  Patient is on T collar currently on 40% FiO2 has been using the PMV  Medications: Reviewed on Rounds  Physical Exam:  Vitals: Temperature is 97.6 pulse 83 respiratory 22 blood pressure is 123/67 saturations 93%  Ventilator Settings continue with T collar currently on 40% FiO2  . General: Comfortable at this time . Eyes: Grossly normal lids, irises & conjunctiva . ENT: grossly tongue is normal . Neck: no obvious mass . Cardiovascular: S1 S2 normal no gallop . Respiratory: No rhonchi no rales are noted at this time . Abdomen: soft . Skin: no rash seen on limited exam . Musculoskeletal: not rigid . Psychiatric:unable to assess . Neurologic: no seizure no involuntary movements         Lab Data:   Basic Metabolic Panel: Recent Labs  Lab 08/31/20 0635 09/04/20 0431 09/06/20 0350  NA 144 148* 139  K 4.4 3.8 3.9  CL 122* 122* 112*  CO2 17* 21* 21*  GLUCOSE 173* 62* 104*  BUN 49* 33* 29*  CREATININE 0.84 0.64 0.52*  CALCIUM 7.6* 8.0* 7.5*    ABG: No results for input(s): PHART, PCO2ART, PO2ART, HCO3, O2SAT in the last 168 hours.  Liver Function Tests: No results for input(s): AST, ALT, ALKPHOS, BILITOT, PROT, ALBUMIN in the last 168 hours. No results for input(s): LIPASE, AMYLASE in the last 168 hours. No results for input(s): AMMONIA in the last 168 hours.  CBC: Recent Labs  Lab 08/31/20 0635 09/04/20 0431 09/06/20 0350  WBC 15.4* 19.8* 12.4*  HGB 9.0* 9.6* 8.0*  HCT 28.4* 29.3* 25.2*  MCV 91.9 93.3 93.7  PLT 226 252 141*    Cardiac Enzymes: No results for input(s): CKTOTAL, CKMB, CKMBINDEX, TROPONINI in  the last 168 hours.  BNP (last 3 results) No results for input(s): BNP in the last 8760 hours.  ProBNP (last 3 results) No results for input(s): PROBNP in the last 8760 hours.  Radiological Exams: DG Chest Port 1 View  Result Date: 09/06/2020 CLINICAL DATA:  Pneumonia.  Chest tube. EXAM: PORTABLE CHEST 1 VIEW COMPARISON:  09/03/2020. FINDINGS: Tracheostomy tube noted in stable position. Left chest tube in stable position. No pneumothorax. Heart size normal. Diffuse bilateral interstitial prominence again noted with slight improvement from prior exam. Persistent bibasilar atelectasis. Developing infiltrate left lung base cannot be degenerative change thoracic spine. Total left shoulder replacement. IMPRESSION: 1. Tracheostomy tube and left chest tube in stable position. No pneumothorax. 2. Diffuse bilateral interstitial prominence again noted with slight improvement from prior exam. Persistent bibasilar atelectasis. Developing infiltrate left lung base cannot be excluded. Electronically Signed   By: Maisie Fus  Register   On: 09/06/2020 06:47    Assessment/Plan Active Problems:   Acute on chronic respiratory failure with hypoxia (HCC)   COVID-19 virus infection   Pneumothorax   Acute respiratory distress syndrome (ARDS) due to COVID-19 virus (HCC)   Tracheostomy status (HCC)   1. Acute on chronic respiratory failure hypoxia patient currently is on T-piece good saturations are noted at this time. 2. COVID-19 virus infection in recovery we will continue with supportive care 3. ARDS treated we will continue to follow 4. Tracheostomy remains in place  I have personally seen and evaluated the patient, evaluated laboratory and imaging results, formulated the assessment and plan and placed orders. The Patient requires high complexity decision making with multiple systems involvement.  Rounds were done with the Respiratory Therapy Director and Staff therapists and discussed with nursing staff  also.  Yevonne Pax, MD [facility] Pulmonary Critical Care Medicine Sleep Medicine

## 2020-09-07 ENCOUNTER — Other Ambulatory Visit (HOSPITAL_COMMUNITY): Payer: Medicare Other

## 2020-09-07 DIAGNOSIS — J939 Pneumothorax, unspecified: Secondary | ICD-10-CM | POA: Diagnosis not present

## 2020-09-07 DIAGNOSIS — Z930 Tracheostomy status: Secondary | ICD-10-CM | POA: Diagnosis not present

## 2020-09-07 DIAGNOSIS — U071 COVID-19: Secondary | ICD-10-CM | POA: Diagnosis not present

## 2020-09-07 DIAGNOSIS — J9621 Acute and chronic respiratory failure with hypoxia: Secondary | ICD-10-CM | POA: Diagnosis not present

## 2020-09-07 LAB — BLOOD CULTURE ID PANEL (REFLEXED) - BCID2
A.calcoaceticus-baumannii: NOT DETECTED
Bacteroides fragilis: NOT DETECTED
Candida albicans: NOT DETECTED
Candida auris: NOT DETECTED
Candida glabrata: NOT DETECTED
Candida krusei: NOT DETECTED
Candida parapsilosis: NOT DETECTED
Candida tropicalis: NOT DETECTED
Cryptococcus neoformans/gattii: DETECTED — AB
Enterobacter cloacae complex: NOT DETECTED
Enterobacterales: NOT DETECTED
Enterococcus Faecium: NOT DETECTED
Enterococcus faecalis: NOT DETECTED
Escherichia coli: NOT DETECTED
Haemophilus influenzae: NOT DETECTED
Klebsiella aerogenes: NOT DETECTED
Klebsiella oxytoca: NOT DETECTED
Klebsiella pneumoniae: NOT DETECTED
Listeria monocytogenes: NOT DETECTED
Neisseria meningitidis: NOT DETECTED
Proteus species: NOT DETECTED
Pseudomonas aeruginosa: NOT DETECTED
Salmonella species: NOT DETECTED
Serratia marcescens: NOT DETECTED
Staphylococcus aureus (BCID): NOT DETECTED
Staphylococcus epidermidis: NOT DETECTED
Staphylococcus lugdunensis: NOT DETECTED
Staphylococcus species: NOT DETECTED
Stenotrophomonas maltophilia: NOT DETECTED
Streptococcus agalactiae: NOT DETECTED
Streptococcus pneumoniae: NOT DETECTED
Streptococcus pyogenes: NOT DETECTED
Streptococcus species: NOT DETECTED

## 2020-09-07 LAB — CULTURE, BLOOD (ROUTINE X 2): Special Requests: ADEQUATE

## 2020-09-07 LAB — HIV ANTIBODY (ROUTINE TESTING W REFLEX): HIV Screen 4th Generation wRfx: NONREACTIVE

## 2020-09-07 NOTE — Progress Notes (Signed)
Pulmonary Critical Care Medicine [facility] [place]   PULMONARY CRITICAL CARE SERVICE  PROGRESS NOTE  Date of Service: 09/07/2020  Brent Farmer  UXL:[id number]  DOB: 1951/10/02   DOA: 08/22/2020  Referring Physician: Carron Curie, MD  HPI: Brent Farmer is a 69 y.o. male seen for follow up of Acute on Chronic Respiratory Failure.  Patient currently is capping 24 hours will be done today  Medications: Reviewed on Rounds  Physical Exam:  Vitals: Temperature is 98.6 pulse 79 respiratory 14 blood pressure is 131/65 saturations 94%  Ventilator Settings capping on 2 L  . General: Comfortable at this time . Eyes: Grossly normal lids, irises & conjunctiva . ENT: grossly tongue is normal . Neck: no obvious mass . Cardiovascular: S1 S2 normal no gallop . Respiratory: No rhonchi no rales are noted at this time . Abdomen: soft . Skin: no rash seen on limited exam . Musculoskeletal: not rigid . Psychiatric:unable to assess . Neurologic: no seizure no involuntary movements         Lab Data:   Basic Metabolic Panel: Recent Labs  Lab 09/04/20 0431 09/06/20 0350  NA 148* 139  K 3.8 3.9  CL 122* 112*  CO2 21* 21*  GLUCOSE 62* 104*  BUN 33* 29*  CREATININE 0.64 0.52*  CALCIUM 8.0* 7.5*    ABG: No results for input(s): PHART, PCO2ART, PO2ART, HCO3, O2SAT in the last 168 hours.  Liver Function Tests: No results for input(s): AST, ALT, ALKPHOS, BILITOT, PROT, ALBUMIN in the last 168 hours. No results for input(s): LIPASE, AMYLASE in the last 168 hours. No results for input(s): AMMONIA in the last 168 hours.  CBC: Recent Labs  Lab 09/04/20 0431 09/06/20 0350  WBC 19.8* 12.4*  HGB 9.6* 8.0*  HCT 29.3* 25.2*  MCV 93.3 93.7  PLT 252 141*    Cardiac Enzymes: No results for input(s): CKTOTAL, CKMB, CKMBINDEX, TROPONINI in the last 168 hours.  BNP (last 3 results) No results for input(s): BNP in the last 8760 hours.  ProBNP (last 3 results) No  results for input(s): PROBNP in the last 8760 hours.  Radiological Exams: DG Chest Port 1 View  Result Date: 09/06/2020 CLINICAL DATA:  Pneumonia.  Chest tube. EXAM: PORTABLE CHEST 1 VIEW COMPARISON:  09/03/2020. FINDINGS: Tracheostomy tube noted in stable position. Left chest tube in stable position. No pneumothorax. Heart size normal. Diffuse bilateral interstitial prominence again noted with slight improvement from prior exam. Persistent bibasilar atelectasis. Developing infiltrate left lung base cannot be degenerative change thoracic spine. Total left shoulder replacement. IMPRESSION: 1. Tracheostomy tube and left chest tube in stable position. No pneumothorax. 2. Diffuse bilateral interstitial prominence again noted with slight improvement from prior exam. Persistent bibasilar atelectasis. Developing infiltrate left lung base cannot be excluded. Electronically Signed   By: Maisie Fus  Register   On: 09/06/2020 06:47    Assessment/Plan Active Problems:   Acute on chronic respiratory failure with hypoxia (HCC)   COVID-19 virus infection   Pneumothorax   Acute respiratory distress syndrome (ARDS) due to COVID-19 virus (HCC)   Tracheostomy status (HCC)   1. Acute on chronic respiratory failure hypoxia we will continue with capping trials goal is for 2 L O2 to be continued and work towards decannulation 2. COVID-19 virus infection recovery phase 3. Pneumothorax resolved 4. ARDS treated we will continue to follow 5. Tracheostomy doing fine with capping   I have personally seen and evaluated the patient, evaluated laboratory and imaging results, formulated the assessment and plan and  placed orders. The Patient requires high complexity decision making with multiple systems involvement.  Rounds were done with the Respiratory Therapy Director and Staff therapists and discussed with nursing staff also.  Allyne Gee, MD [facility] Pulmonary Critical Care Medicine Sleep Medicine

## 2020-09-08 DIAGNOSIS — J939 Pneumothorax, unspecified: Secondary | ICD-10-CM | POA: Diagnosis not present

## 2020-09-08 DIAGNOSIS — Z930 Tracheostomy status: Secondary | ICD-10-CM | POA: Diagnosis not present

## 2020-09-08 DIAGNOSIS — U071 COVID-19: Secondary | ICD-10-CM | POA: Diagnosis not present

## 2020-09-08 DIAGNOSIS — J9621 Acute and chronic respiratory failure with hypoxia: Secondary | ICD-10-CM | POA: Diagnosis not present

## 2020-09-08 LAB — CBC
HCT: 27.1 % — ABNORMAL LOW (ref 39.0–52.0)
Hemoglobin: 9 g/dL — ABNORMAL LOW (ref 13.0–17.0)
MCH: 31 pg (ref 26.0–34.0)
MCHC: 33.2 g/dL (ref 30.0–36.0)
MCV: 93.4 fL (ref 80.0–100.0)
Platelets: 157 10*3/uL (ref 150–400)
RBC: 2.9 MIL/uL — ABNORMAL LOW (ref 4.22–5.81)
RDW: 23.4 % — ABNORMAL HIGH (ref 11.5–15.5)
WBC: 9.7 10*3/uL (ref 4.0–10.5)
nRBC: 0 % (ref 0.0–0.2)

## 2020-09-08 LAB — COMPREHENSIVE METABOLIC PANEL
ALT: 45 U/L — ABNORMAL HIGH (ref 0–44)
AST: 23 U/L (ref 15–41)
Albumin: 1.9 g/dL — ABNORMAL LOW (ref 3.5–5.0)
Alkaline Phosphatase: 84 U/L (ref 38–126)
Anion gap: 7 (ref 5–15)
BUN: 19 mg/dL (ref 8–23)
CO2: 20 mmol/L — ABNORMAL LOW (ref 22–32)
Calcium: 8 mg/dL — ABNORMAL LOW (ref 8.9–10.3)
Chloride: 109 mmol/L (ref 98–111)
Creatinine, Ser: 0.55 mg/dL — ABNORMAL LOW (ref 0.61–1.24)
GFR, Estimated: >60 mL/min (ref >60)
Glucose, Bld: 236 mg/dL — ABNORMAL HIGH (ref 70–99)
Potassium: 3.9 mmol/L (ref 3.5–5.1)
Sodium: 136 mmol/L (ref 135–145)
Total Bilirubin: 0.3 mg/dL (ref 0.3–1.2)
Total Protein: 4.8 g/dL — ABNORMAL LOW (ref 6.5–8.1)

## 2020-09-08 LAB — PROTIME-INR
INR: 1.2 (ref 0.8–1.2)
Prothrombin Time: 14.5 s (ref 11.4–15.2)

## 2020-09-08 LAB — VANCOMYCIN, TROUGH: Vancomycin Tr: 32 ug/mL (ref 15–20)

## 2020-09-08 NOTE — Progress Notes (Signed)
Pulmonary Critical Care Medicine [facility] [place]   PULMONARY CRITICAL CARE SERVICE  PROGRESS NOTE  Date of Service: 09/08/2020  Brent Farmer  TIR:[id number]  DOB: Jun 26, 1951   DOA: 08/22/2020  Referring Physician: Carron Curie, MD  HPI: Brent Farmer is a 69 y.o. male seen for follow up of Acute on Chronic Respiratory Failure.  Continues to do fairly well with capping right now is on 2 L today will be 48 hours  Medications: Reviewed on Rounds  Physical Exam:  Vitals: Temperature is 97.5 pulse 89 respiratory rate 17 blood pressure is 141/74 saturations 95%  Ventilator Settings capping on 2 L of oxygen  . General: Comfortable at this time . Eyes: Grossly normal lids, irises & conjunctiva . ENT: grossly tongue is normal . Neck: no obvious mass . Cardiovascular: S1 S2 normal no gallop . Respiratory: No rhonchi no rales noted at this time . Abdomen: soft . Skin: no rash seen on limited exam . Musculoskeletal: not rigid . Psychiatric:unable to assess . Neurologic: no seizure no involuntary movements         Lab Data:   Basic Metabolic Panel: Recent Labs  Lab 09/04/20 0431 09/06/20 0350 09/08/20 0707  NA 148* 139 136  K 3.8 3.9 3.9  CL 122* 112* 109  CO2 21* 21* 20*  GLUCOSE 62* 104* 236*  BUN 33* 29* 19  CREATININE 0.64 0.52* 0.55*  CALCIUM 8.0* 7.5* 8.0*    ABG: No results for input(s): PHART, PCO2ART, PO2ART, HCO3, O2SAT in the last 168 hours.  Liver Function Tests: Recent Labs  Lab 09/08/20 0707  AST 23  ALT 45*  ALKPHOS 84  BILITOT 0.3  PROT 4.8*  ALBUMIN 1.9*   No results for input(s): LIPASE, AMYLASE in the last 168 hours. No results for input(s): AMMONIA in the last 168 hours.  CBC: Recent Labs  Lab 09/04/20 0431 09/06/20 0350 09/08/20 0707  WBC 19.8* 12.4* 9.7  HGB 9.6* 8.0* 9.0*  HCT 29.3* 25.2* 27.1*  MCV 93.3 93.7 93.4  PLT 252 141* 157    Cardiac Enzymes: No results for input(s): CKTOTAL, CKMB, CKMBINDEX,  TROPONINI in the last 168 hours.  BNP (last 3 results) No results for input(s): BNP in the last 8760 hours.  ProBNP (last 3 results) No results for input(s): PROBNP in the last 8760 hours.  Radiological Exams: DG Chest Port 1 View  Result Date: 09/07/2020 CLINICAL DATA:  Pneumothorax. EXAM: PORTABLE CHEST 1 VIEW COMPARISON:  September 06, 2020 FINDINGS: The left chest tube is stable. No pneumothorax identified bilaterally. Tracheostomy tube is stable. Opacity throughout the left lung is stable. Minimal opacity in the lateral right lung base is stable and probably nonacute. No change in the cardiomediastinal silhouette. There appears to be air in the subcutaneous tissues adjacent to the right clavicle. IMPRESSION: 1. No pneumothorax. Stable left chest tube. Stable opacity throughout the left chest. 2. Apparent air in the subcutaneous tissues adjacent to the right clavicle of uncertain significance. Electronically Signed   By: Gerome Sam III M.D   On: 09/07/2020 12:43    Assessment/Plan Active Problems:   Acute on chronic respiratory failure with hypoxia (HCC)   COVID-19 virus infection   Pneumothorax   Acute respiratory distress syndrome (ARDS) due to COVID-19 virus (HCC)   Tracheostomy status (HCC)   1. Acute on chronic respiratory failure hypoxia we will continue with capping trials titrate oxygen as necessary 2. COVID-19 virus infection in recovery we will continue with supportive care 3. Pneumothorax resolved  4. ARDS treated slow improvement 5. Tracheostomy remains in place   I have personally seen and evaluated the patient, evaluated laboratory and imaging results, formulated the assessment and plan and placed orders. The Patient requires high complexity decision making with multiple systems involvement.  Rounds were done with the Respiratory Therapy Director and Staff therapists and discussed with nursing staff also.  Yevonne Pax, MD [facility] Pulmonary Critical Care  Medicine Sleep Medicine

## 2020-09-09 ENCOUNTER — Other Ambulatory Visit (HOSPITAL_COMMUNITY): Payer: Medicare Other

## 2020-09-09 DIAGNOSIS — Z930 Tracheostomy status: Secondary | ICD-10-CM | POA: Diagnosis not present

## 2020-09-09 DIAGNOSIS — U071 COVID-19: Secondary | ICD-10-CM | POA: Diagnosis not present

## 2020-09-09 DIAGNOSIS — J939 Pneumothorax, unspecified: Secondary | ICD-10-CM | POA: Diagnosis not present

## 2020-09-09 DIAGNOSIS — J9621 Acute and chronic respiratory failure with hypoxia: Secondary | ICD-10-CM | POA: Diagnosis not present

## 2020-09-09 LAB — VANCOMYCIN, TROUGH: Vancomycin Tr: 8 ug/mL — ABNORMAL LOW (ref 15–20)

## 2020-09-09 LAB — T-HELPER CELLS (CD4) COUNT (NOT AT ARMC)
CD4 % Helper T Cell: 26 % — ABNORMAL LOW (ref 33–65)
CD4 T Cell Abs: 174 /uL — ABNORMAL LOW (ref 400–1790)

## 2020-09-09 NOTE — Consult Note (Signed)
Infectious Disease Consultation   Brent Farmer  NIO:[id number]  DOB: 11-05-1950  DOA: 08/22/2020  Requesting physician: Dr. Sharyon Medicus  Reason for consultation: Antibiotic recommendations   History of Present Illness: Brent Farmer is an 69 y.o. male with multiple medical problems include diabetes mellitus, GERD, hypertension, hyperlipidemia who presented to the acute facility on 07/16/2020 for worsening shortness of breath, hypoxemia.  He was found to have COVID-19 infection with pneumonia.  He unfortunately went into worsening respiratory failure and had to be intubated on 08/07/2020.  He failed extubation trials.  His hospital course was complicated by left-sided pneumothorax status post chest tube placement on 08/16/2020 and removed on 08/19/2020 due to dislodgment.  He also had dysphagia and had PEG tube placed on 08/21/2020.  Due to his complex medical problems he was transferred to [facility] and admitted here on 08/22/2020.  After admission here he started having worsening leukocytosis with fever.  He received treatment with multiple antibiotics.  He was treated with cefepime, Flagyl. He also was treated with acyclovir for encephalopathy. However, as soon as the antibiotics are stopped he started having fevers again.  He had a central line which was discontinued and he was started on empiric fluconazole.  Repeat blood cultures ordered on 09/04/2020 showing cryptococcus neoformans.  He is having shortness of breath with some cough.  He said previously also diarrhea.  He was on laxative and it was stopped.  He denies having any chest pain, nausea, vomiting or abdominal pain.  Review of Systems:  Review of systems negative except as mentioned above in the HPI  Past Medical History: Past Medical History:  Diagnosis Date  . Acute on chronic respiratory failure with hypoxia (HCC)   . Acute respiratory distress syndrome (ARDS) due to COVID-19 virus (HCC)   . COVID-19 virus  infection   . Pneumothorax   . Tracheostomy status (HCC)   Diabetes mellitus, GERD, hyperlipidemia, hypertension  Past Surgical History: Shoulder replacement, pneumothorax, tracheostomy, PEG tube placement  Allergies: No known drug allergies  Social History: History of smoking, occasional alcohol use.  No mention of drug abuse.  Family History: Noncontributory to the present illness  Physical Exam: Vitals: Temperature 98.4, pulse 83, respiratory rate 17, blood pressure 122/61, pulse oximetry 97%  Constitutional: Frail, ill-appearing male, awake Head: Atraumatic, normocephalic Eyes: PERLA, EOMI  ENMT: external ears and nose appear normal, normal hearing, moist oral mucosa Neck: Supple, decannulated CVS: S1-S2  Respiratory: Rhonchi, no wheezing Abdomen: soft nontender, positive bowel sounds Musculoskeletal: No edema Neuro: He has severe debility with generalized weakness, otherwise nonfocal Psych: stable mood and affect Skin: no rashes   Data reviewed:  I have personally reviewed following labs and imaging studies Labs:  CBC: Recent Labs  Lab 09/04/20 0431 09/06/20 0350 09/08/20 0707  WBC 19.8* 12.4* 9.7  HGB 9.6* 8.0* 9.0*  HCT 29.3* 25.2* 27.1*  MCV 93.3 93.7 93.4  PLT 252 141* 157    Basic Metabolic Panel: Recent Labs  Lab 09/04/20 0431 09/04/20 0431 09/06/20 0350 09/08/20 0707  NA 148*  --  139 136  K 3.8   < > 3.9 3.9  CL 122*  --  112* 109  CO2 21*  --  21* 20*  GLUCOSE 62*  --  104* 236*  BUN 33*  --  29* 19  CREATININE 0.64  --  0.52* 0.55*  CALCIUM 8.0*  --  7.5* 8.0*   < > = values in  this interval not displayed.   GFR CrCl cannot be calculated (Unknown ideal weight.). Liver Function Tests: Recent Labs  Lab 09/08/20 0707  AST 23  ALT 45*  ALKPHOS 84  BILITOT 0.3  PROT 4.8*  ALBUMIN 1.9*   No results for input(s): LIPASE, AMYLASE in the last 168 hours. No results for input(s): AMMONIA in the last 168 hours. Coagulation  profile Recent Labs  Lab 09/08/20 0707  INR 1.2    Cardiac Enzymes: No results for input(s): CKTOTAL, CKMB, CKMBINDEX, TROPONINI in the last 168 hours. BNP: Invalid input(s): POCBNP CBG: No results for input(s): GLUCAP in the last 168 hours. D-Dimer No results for input(s): DDIMER in the last 72 hours. Hgb A1c No results for input(s): HGBA1C in the last 72 hours. Lipid Profile No results for input(s): CHOL, HDL, LDLCALC, TRIG, CHOLHDL, LDLDIRECT in the last 72 hours. Thyroid function studies No results for input(s): TSH, T4TOTAL, T3FREE, THYROIDAB in the last 72 hours.  Invalid input(s): FREET3 Anemia work up No results for input(s): VITAMINB12, FOLATE, FERRITIN, TIBC, IRON, RETICCTPCT in the last 72 hours. Urinalysis    Component Value Date/Time   COLORURINE YELLOW 09/05/2020 1820   APPEARANCEUR CLEAR 09/05/2020 1820   LABSPEC 1.015 09/05/2020 1820   PHURINE 7.0 09/05/2020 1820   GLUCOSEU 50 (A) 09/05/2020 1820   HGBUR NEGATIVE 09/05/2020 1820   BILIRUBINUR NEGATIVE 09/05/2020 1820   KETONESUR NEGATIVE 09/05/2020 1820   PROTEINUR NEGATIVE 09/05/2020 1820   NITRITE NEGATIVE 09/05/2020 1820   LEUKOCYTESUR NEGATIVE 09/05/2020 1820     Microbiology Recent Results (from the past 240 hour(s))  Culture, respiratory (non-expectorated)     Status: None   Collection Time: 08/31/20 11:05 AM   Specimen: Tracheal Aspirate; Respiratory  Result Value Ref Range Status   Specimen Description TRACHEAL ASPIRATE  Final   Special Requests NONE  Final   Gram Stain   Final    FEW WBC PRESENT, PREDOMINANTLY PMN RARE YEAST Performed at [facility], [address], [place], [place] [address]    Culture ABUNDANT CANDIDA ALBICANS  Final   Report Status 09/02/2020 FINAL  Final  Culture, blood (Routine X 2) w Reflex to ID Panel     Status: Abnormal (Preliminary result)   Collection Time: 09/04/20 11:33 AM   Specimen: BLOOD  Result Value Ref Range Status   Specimen Description  BLOOD LEFT ANTECUBITAL  Final   Special Requests   Final    BOTTLES DRAWN AEROBIC AND ANAEROBIC Blood Culture adequate volume   Culture  Setup Time   Final    YEAST AEROBIC BOTTLE ONLY Organism ID to follow CRITICAL RESULT CALLED TO, READ BACK BY AND VERIFIED WITH: Mack Guise RN, AT 1237 09/07/20 BY Renato Shin Performed at [facility], [address], [place], [place] [address]    Culture CRYPTOCOCCUS NEOFORMANS (A)  Final   Report Status PENDING  Incomplete  Blood Culture ID Panel (Reflexed)     Status: Abnormal   Collection Time: 09/04/20 11:33 AM  Result Value Ref Range Status   Enterococcus faecalis NOT DETECTED NOT DETECTED Final   Enterococcus Faecium NOT DETECTED NOT DETECTED Final   Listeria monocytogenes NOT DETECTED NOT DETECTED Final   Staphylococcus species NOT DETECTED NOT DETECTED Final   Staphylococcus aureus (BCID) NOT DETECTED NOT DETECTED Final   Staphylococcus epidermidis NOT DETECTED NOT DETECTED Final   Staphylococcus lugdunensis NOT DETECTED NOT DETECTED Final   Streptococcus species NOT DETECTED NOT DETECTED Final   Streptococcus agalactiae NOT  DETECTED NOT DETECTED Final   Streptococcus pneumoniae NOT DETECTED NOT DETECTED Final   Streptococcus pyogenes NOT DETECTED NOT DETECTED Final   A.calcoaceticus-baumannii NOT DETECTED NOT DETECTED Final   Bacteroides fragilis NOT DETECTED NOT DETECTED Final   Enterobacterales NOT DETECTED NOT DETECTED Final   Enterobacter cloacae complex NOT DETECTED NOT DETECTED Final   Escherichia coli NOT DETECTED NOT DETECTED Final   Klebsiella aerogenes NOT DETECTED NOT DETECTED Final   Klebsiella oxytoca NOT DETECTED NOT DETECTED Final   Klebsiella pneumoniae NOT DETECTED NOT DETECTED Final   Proteus species NOT DETECTED NOT DETECTED Final   Salmonella species NOT DETECTED NOT DETECTED Final   Serratia marcescens NOT DETECTED NOT DETECTED Final   Haemophilus influenzae NOT DETECTED NOT DETECTED Final   Neisseria  meningitidis NOT DETECTED NOT DETECTED Final   Pseudomonas aeruginosa NOT DETECTED NOT DETECTED Final   Stenotrophomonas maltophilia NOT DETECTED NOT DETECTED Final   Candida albicans NOT DETECTED NOT DETECTED Final   Candida auris NOT DETECTED NOT DETECTED Final   Candida glabrata NOT DETECTED NOT DETECTED Final   Candida krusei NOT DETECTED NOT DETECTED Final   Candida parapsilosis NOT DETECTED NOT DETECTED Final   Candida tropicalis NOT DETECTED NOT DETECTED Final   Cryptococcus neoformans/gattii DETECTED (A) NOT DETECTED Final    Comment: CRITICAL RESULT CALLED TO, READ BACK BY AND VERIFIED WITH: Mack Guise RN, AT 1237 09/07/20 BY Renato Shin Performed at [facility], [address] [place], [place] [address]   Culture, blood (Routine X 2) w Reflex to ID Panel     Status: Abnormal   Collection Time: 09/04/20 11:40 AM   Specimen: BLOOD LEFT WRIST  Result Value Ref Range Status   Specimen Description BLOOD LEFT WRIST  Final   Special Requests   Final    BOTTLES DRAWN AEROBIC AND ANAEROBIC Blood Culture adequate volume   Culture  Setup Time   Final    GRAM POSITIVE COCCI AEROBIC BOTTLE ONLY CRITICAL RESULT CALLED TO, READ BACK BY AND VERIFIED WITH: Bryson Dames RN 1725 09/05/20 A BROWNING    Culture (A)  Final    STAPHYLOCOCCUS EPIDERMIDIS THE SIGNIFICANCE OF ISOLATING THIS ORGANISM FROM A SINGLE SET OF BLOOD CULTURES WHEN MULTIPLE SETS ARE DRAWN IS UNCERTAIN. PLEASE NOTIFY THE MICROBIOLOGY DEPARTMENT WITHIN ONE WEEK IF SPECIATION AND SENSITIVITIES ARE REQUIRED. Performed at [facility], [address], [place], [place] [address]    Report Status 09/07/2020 FINAL  Final  Blood Culture ID Panel (Reflexed)     Status: Abnormal   Collection Time: 09/04/20 11:40 AM  Result Value Ref Range Status   Enterococcus faecalis NOT DETECTED NOT DETECTED Final   Enterococcus Faecium NOT DETECTED NOT DETECTED Final   Listeria monocytogenes NOT DETECTED NOT DETECTED Final    Staphylococcus species DETECTED (A) NOT DETECTED Final    Comment: CRITICAL RESULT CALLED TO, READ BACK BY AND VERIFIED WITH: Bryson Dames RN 1725 09/05/20 A BROWNING    Staphylococcus aureus (BCID) NOT DETECTED NOT DETECTED Final   Staphylococcus epidermidis DETECTED (A) NOT DETECTED Final    Comment: Methicillin (oxacillin) resistant coagulase negative staphylococcus. Possible blood culture contaminant (unless isolated from more than one blood culture draw or clinical case suggests pathogenicity). No antibiotic treatment is indicated for blood  culture contaminants. CRITICAL RESULT CALLED TO, READ BACK BY AND VERIFIED WITH: Bryson Dames RN 1725 09/05/20 A BROWNING    Staphylococcus lugdunensis NOT DETECTED NOT DETECTED Final   Streptococcus species NOT DETECTED NOT DETECTED  Final   Streptococcus agalactiae NOT DETECTED NOT DETECTED Final   Streptococcus pneumoniae NOT DETECTED NOT DETECTED Final   Streptococcus pyogenes NOT DETECTED NOT DETECTED Final   A.calcoaceticus-baumannii NOT DETECTED NOT DETECTED Final   Bacteroides fragilis NOT DETECTED NOT DETECTED Final   Enterobacterales NOT DETECTED NOT DETECTED Final   Enterobacter cloacae complex NOT DETECTED NOT DETECTED Final   Escherichia coli NOT DETECTED NOT DETECTED Final   Klebsiella aerogenes NOT DETECTED NOT DETECTED Final   Klebsiella oxytoca NOT DETECTED NOT DETECTED Final   Klebsiella pneumoniae NOT DETECTED NOT DETECTED Final   Proteus species NOT DETECTED NOT DETECTED Final   Salmonella species NOT DETECTED NOT DETECTED Final   Serratia marcescens NOT DETECTED NOT DETECTED Final   Haemophilus influenzae NOT DETECTED NOT DETECTED Final   Neisseria meningitidis NOT DETECTED NOT DETECTED Final   Pseudomonas aeruginosa NOT DETECTED NOT DETECTED Final   Stenotrophomonas maltophilia NOT DETECTED NOT DETECTED Final   Candida albicans NOT DETECTED NOT DETECTED Final   Candida auris NOT DETECTED NOT DETECTED Final   Candida glabrata  NOT DETECTED NOT DETECTED Final   Candida krusei NOT DETECTED NOT DETECTED Final   Candida parapsilosis NOT DETECTED NOT DETECTED Final   Candida tropicalis NOT DETECTED NOT DETECTED Final   Cryptococcus neoformans/gattii NOT DETECTED NOT DETECTED Final   Methicillin resistance mecA/C DETECTED (A) NOT DETECTED Final    Comment: CRITICAL RESULT CALLED TO, READ BACK BY AND VERIFIED WITH: Bryson Dames RN 1725 09/05/20 A BROWNING Performed at [facility], [address] [place], [place] [address]   Culture, blood (routine x 2)     Status: None (Preliminary result)   Collection Time: 09/07/20  5:00 AM   Specimen: BLOOD  Result Value Ref Range Status   Specimen Description BLOOD LEFT ANTECUBITAL  Final   Special Requests   Final    BOTTLES DRAWN AEROBIC AND ANAEROBIC Blood Culture results may not be optimal due to an inadequate volume of blood received in culture bottles   Culture   Final    NO GROWTH 2 DAYS Performed at [facility], [address] [place], [place] [address]    Report Status PENDING  Incomplete  Culture, blood (routine x 2)     Status: None (Preliminary result)   Collection Time: 09/07/20  5:10 AM   Specimen: BLOOD LEFT FOREARM  Result Value Ref Range Status   Specimen Description BLOOD LEFT FOREARM  Final   Special Requests   Final    BOTTLES DRAWN AEROBIC AND ANAEROBIC Blood Culture adequate volume   Culture   Final    NO GROWTH 2 DAYS Performed at [facility], [address], [place], [place] [address]    Report Status PENDING  Incomplete   Inpatient Medications:   Please see MAR  Radiological Exams on Admission: DG CHEST PORT 1 VIEW  Result Date: 09/09/2020 CLINICAL DATA:  PICC line placement EXAM: PORTABLE CHEST 1 VIEW COMPARISON:  Portable exam 1431 hours compared to 1300 hours, earlier study 08/22/2020 FINDINGS: LEFT arm PICC line with tip projecting over SVC above cavoatrial junction. Stable heart size mediastinal contours. BILATERAL  pulmonary infiltrates LEFT greater than RIGHT, increased since 08/22/2020, especially on LEFT, likely representing a combination of underlying chronic interstitial lung disease, bibasilar atelectasis, and superimposed LEFT lung infiltrate. No pneumothorax or acute osseous findings. LEFT shoulder prosthesis noted. IMPRESSION: Tip of LEFT arm PICC line projects over SVC. Bibasilar atelectasis, underlying chronic interstitial lung disease changes, and  superimposed LEFT lung infiltrate. Electronically Signed   By: Ulyses SouthwardMark  Boles M.D.   On: 09/09/2020 14:44   DG CHEST PORT 1 VIEW  Result Date: 09/09/2020 CLINICAL DATA:  Central catheter placement EXAM: PORTABLE CHEST 1 VIEW COMPARISON:  September 09, 2020 study obtained earlier in the day and multiple recent prior studies FINDINGS: Tracheostomy no longer evident. Peripherally inserted central catheter on the left has its tip in the left innominate vein. No pneumothorax. There are areas of underlying apparent fibrosis. No edema or airspace opacity. Heart size and pulmonary vascularity normal. No adenopathy. Total shoulder replacement on the left noted. Healed rib fractures noted on the left, stable. IMPRESSION: Tracheostomy no longer appreciable. Central catheter tip in left innominate vein. No pneumothorax. Underlying fibrotic type changes. No edema or airspace opacity. Stable cardiac silhouette. Electronically Signed   By: Bretta BangWilliam  Woodruff III M.D.   On: 09/09/2020 13:15   DG CHEST PORT 1 VIEW  Result Date: 09/09/2020 CLINICAL DATA:  Follow-up pneumothorax.  COVID EXAM: PORTABLE CHEST 1 VIEW COMPARISON:  09/07/2020 FINDINGS: Tracheostomy tube is present. Shallow inspiration with mild cardiac enlargement. Coarse infiltrates or scarring in the lungs mostly on the left. No pleural effusions. Subcutaneous emphysema in the base of the neck with likely extension into the mediastinum. No pneumothorax. Old left rib fractures. Old resection or resorption of the distal  right clavicle. Tracheostomy tube and postoperative changes in the left shoulder. Similar appearance to previous study, allowing for differences in technique. IMPRESSION: Coarse infiltrates or scarring in the lungs, mostly on the left. Subcutaneous and mediastinal emphysema. No significant change since previous studies. Electronically Signed   By: Burman NievesWilliam  Stevens M.D.   On: 09/09/2020 06:45    Impression/Recommendations Active Problems:  Acute on chronic respiratory failure with hypoxia  Fungemia with cryptococcus neoformans Fever/leukocytosis   COVID-19 virus infection Gram-positive bacteremia Herpes stomatitis   Pneumothorax   Acute respiratory distress syndrome (ARDS) due to COVID-19 virus  Diabetes mellitus Protein calorie malnutrition   Acute on chronic respiratory failure with hypoxemia: Patient initially had COVID-19 infection.  Subsequently developed secondary bacterial pneumonia.  He was treated for COVID-19 infection in the acute facility.  Here he has had on and off fevers and leukocytosis and has received multiple antibiotic treatments.  Respiratory cultures from 08/31/2020 showed abundant Candida albicans.  He received treatment with empiric cefepime, Flagyl.  However, as soon as antibiotics were stopped he started having fever and leukocytosis.  Levaquin was added to cover for atypical organisms.  He also had gram-positive bacteremia on IV vancomycin.  Fluconazole added.  He is continuing to have shortness of breath with some cough.  He was just decannulated today, currently on oxygen nasal cannula.  High risk for recurrent pneumonia.  If he starts having any worsening shortness of breath would recommend CT of the chest to better evaluate.  Fungemia: He had blood cultures from 09/04/2020 which were done due to fever and worsening leukocytosis.  Blood culture showing cryptococcus.  He was on low-dose fluconazole which was added for Candida on the respiratory cultures.  At this time  the exact source is unclear.  His mentation is fine and he is awake and oriented x3.  No signs of encephalopathy or meningeal symptoms at this time.  Initially we had thought to treat him with amphotericin B and flucytosine induction.  However, per pharmacy these are not available at this time.  Therefore we had no other choice but to increase the dose of fluconazole and treat him with  high-dose fluconazole.  Repeat blood cultures ordered.  He had a chest tube that has been removed.  Follow-up on the repeat blood cultures and treat accordingly.  If the repeat blood cultures also continue to be positive then we may need to consider starting him on amphotericin and flucytosine induction therapy.  Fever/leukocytosis: He is currently on treatment with IV vancomycin, Levaquin, high-dose fluconazole.  His blood culture showing gram-positive cocci which turned out to be a staph epidermidis.  He also had cryptococcus neoformans on his blood cultures.  Chest imaging concerning for pneumonia.  Continue to monitor closely.  He is also high risk for aspiration and recurrent aspiration pneumonia.  Please monitor BUN/creatinine closely while on the antimicrobials and also monitor CBC and CMP.  COVID-19 infection: As mentioned above he was treated for COVID-19 infection at the acute facility.  Here he received treatment with hydroxyurea.  He also had ARDS due to COVID-19 infection.  Continue supportive management per the primary team.  Herpes stomatitis: Treated with IV acyclovir.  Improving.  Pneumothorax: He had chest tube placed previously.  Now removed.  Continue management per the primary team.  Diabetes mellitus: Continue to monitor Accu-Cheks, management of diabetes per the primary team.  Protein calorie malnutrition: Management per the primary team.  Due to his complex medical problems he is high risk for worsening and decompensation.  Thank you for this consultation.  Plan of care discussed with the patient,  primary team and pharmacy.  Vonzella Nipple M.D. 09/09/2020, 6:10 PM

## 2020-09-09 NOTE — Progress Notes (Signed)
Pulmonary Critical Care Medicine [facility] [place]   PULMONARY CRITICAL CARE SERVICE  PROGRESS NOTE  Date of Service: 09/09/2020  Brent Farmer  RCV:[id number]  DOB: 01-13-1951   DOA: 08/22/2020  Referring Physician: Carron Curie, MD  HPI: Brent Farmer is a 69 y.o. male seen for follow up of Acute on Chronic Respiratory Failure.  Patient is capping doing well now today's third day ready for decannulation  Medications: Reviewed on Rounds  Physical Exam:  Vitals: Temperature is 96.9 pulse 88 respiratory 23 blood pressure is 140/77 saturations 98%  Ventilator Settings capping off the ventilator  . General: Comfortable at this time . Eyes: Grossly normal lids, irises & conjunctiva . ENT: grossly tongue is normal . Neck: no obvious mass . Cardiovascular: S1 S2 normal no gallop . Respiratory: No rhonchi no rales are noted at this time . Abdomen: soft . Skin: no rash seen on limited exam . Musculoskeletal: not rigid . Psychiatric:unable to assess . Neurologic: no seizure no involuntary movements         Lab Data:   Basic Metabolic Panel: Recent Labs  Lab 09/04/20 0431 09/06/20 0350 09/08/20 0707  NA 148* 139 136  K 3.8 3.9 3.9  CL 122* 112* 109  CO2 21* 21* 20*  GLUCOSE 62* 104* 236*  BUN 33* 29* 19  CREATININE 0.64 0.52* 0.55*  CALCIUM 8.0* 7.5* 8.0*    ABG: No results for input(s): PHART, PCO2ART, PO2ART, HCO3, O2SAT in the last 168 hours.  Liver Function Tests: Recent Labs  Lab 09/08/20 0707  AST 23  ALT 45*  ALKPHOS 84  BILITOT 0.3  PROT 4.8*  ALBUMIN 1.9*   No results for input(s): LIPASE, AMYLASE in the last 168 hours. No results for input(s): AMMONIA in the last 168 hours.  CBC: Recent Labs  Lab 09/04/20 0431 09/06/20 0350 09/08/20 0707  WBC 19.8* 12.4* 9.7  HGB 9.6* 8.0* 9.0*  HCT 29.3* 25.2* 27.1*  MCV 93.3 93.7 93.4  PLT 252 141* 157    Cardiac Enzymes: No results for input(s): CKTOTAL, CKMB, CKMBINDEX, TROPONINI  in the last 168 hours.  BNP (last 3 results) No results for input(s): BNP in the last 8760 hours.  ProBNP (last 3 results) No results for input(s): PROBNP in the last 8760 hours.  Radiological Exams: DG CHEST PORT 1 VIEW  Result Date: 09/09/2020 CLINICAL DATA:  Follow-up pneumothorax.  COVID EXAM: PORTABLE CHEST 1 VIEW COMPARISON:  09/07/2020 FINDINGS: Tracheostomy tube is present. Shallow inspiration with mild cardiac enlargement. Coarse infiltrates or scarring in the lungs mostly on the left. No pleural effusions. Subcutaneous emphysema in the base of the neck with likely extension into the mediastinum. No pneumothorax. Old left rib fractures. Old resection or resorption of the distal right clavicle. Tracheostomy tube and postoperative changes in the left shoulder. Similar appearance to previous study, allowing for differences in technique. IMPRESSION: Coarse infiltrates or scarring in the lungs, mostly on the left. Subcutaneous and mediastinal emphysema. No significant change since previous studies. Electronically Signed   By: Burman Nieves M.D.   On: 09/09/2020 06:45   DG Chest Port 1 View  Result Date: 09/07/2020 CLINICAL DATA:  Pneumothorax. EXAM: PORTABLE CHEST 1 VIEW COMPARISON:  September 06, 2020 FINDINGS: The left chest tube is stable. No pneumothorax identified bilaterally. Tracheostomy tube is stable. Opacity throughout the left lung is stable. Minimal opacity in the lateral right lung base is stable and probably nonacute. No change in the cardiomediastinal silhouette. There appears to be air in  the subcutaneous tissues adjacent to the right clavicle. IMPRESSION: 1. No pneumothorax. Stable left chest tube. Stable opacity throughout the left chest. 2. Apparent air in the subcutaneous tissues adjacent to the right clavicle of uncertain significance. Electronically Signed   By: Gerome Sam III M.D   On: 09/07/2020 12:43    Assessment/Plan Active Problems:   Acute on chronic  respiratory failure with hypoxia (HCC)   COVID-19 virus infection   Pneumothorax   Acute respiratory distress syndrome (ARDS) due to COVID-19 virus (HCC)   Tracheostomy status (HCC)   1. Acute on chronic respiratory failure hypoxia we will proceed to decannulation next  2. COVID-19 virus infection recovery 3. Pneumonia due to COVID-19 treated continue to follow 4. ARDS slow improvement 5. Tracheostomy will be removed   I have personally seen and evaluated the patient, evaluated laboratory and imaging results, formulated the assessment and plan and placed orders. The Patient requires high complexity decision making with multiple systems involvement.  Rounds were done with the Respiratory Therapy Director and Staff therapists and discussed with nursing staff also.  Yevonne Pax, MD [facility] Pulmonary Critical Care Medicine Sleep Medicine

## 2020-09-09 NOTE — Progress Notes (Signed)
Patient assessed at the bedside for ongoing left chest tube (placed 08/28/20) evaluation. Per patient, chest tube was removed this morning because he was told he "didn't need it anymore". Site assessed - large bandage over the site. Bedside RN and MD were both at lunch and unavailable for further information. The paper chart was checked and I was unable to locate any information about the chest tube removal. The pulmonary MD saw the patient this morning and no information pertaining to the chest tube was in his note.  IR available if needed.   Alwyn Ren, [place] [phone] 09/09/2020, 2:01 PM

## 2020-09-10 LAB — VANCOMYCIN, TROUGH: Vancomycin Tr: 17 ug/mL (ref 15–20)

## 2020-09-11 LAB — BASIC METABOLIC PANEL
Anion gap: 9 (ref 5–15)
BUN: 26 mg/dL — ABNORMAL HIGH (ref 8–23)
CO2: 19 mmol/L — ABNORMAL LOW (ref 22–32)
Calcium: 8 mg/dL — ABNORMAL LOW (ref 8.9–10.3)
Chloride: 110 mmol/L (ref 98–111)
Creatinine, Ser: 0.52 mg/dL — ABNORMAL LOW (ref 0.61–1.24)
GFR, Estimated: >60 mL/min (ref >60)
Glucose, Bld: 241 mg/dL — ABNORMAL HIGH (ref 70–99)
Potassium: 4 mmol/L (ref 3.5–5.1)
Sodium: 138 mmol/L (ref 135–145)

## 2020-09-12 LAB — CBC
HCT: 28.9 % — ABNORMAL LOW (ref 39.0–52.0)
Hemoglobin: 9.4 g/dL — ABNORMAL LOW (ref 13.0–17.0)
MCH: 31.3 pg (ref 26.0–34.0)
MCHC: 32.5 g/dL (ref 30.0–36.0)
MCV: 96.3 fL (ref 80.0–100.0)
Platelets: 126 10*3/uL — ABNORMAL LOW (ref 150–400)
RBC: 3 MIL/uL — ABNORMAL LOW (ref 4.22–5.81)
RDW: 23.2 % — ABNORMAL HIGH (ref 11.5–15.5)
WBC: 8.5 10*3/uL (ref 4.0–10.5)
nRBC: 0.2 % (ref 0.0–0.2)

## 2020-09-12 LAB — COMPREHENSIVE METABOLIC PANEL
ALT: 40 U/L (ref 0–44)
AST: 24 U/L (ref 15–41)
Albumin: 1.9 g/dL — ABNORMAL LOW (ref 3.5–5.0)
Alkaline Phosphatase: 83 U/L (ref 38–126)
Anion gap: 11 (ref 5–15)
BUN: 24 mg/dL — ABNORMAL HIGH (ref 8–23)
CO2: 19 mmol/L — ABNORMAL LOW (ref 22–32)
Calcium: 8 mg/dL — ABNORMAL LOW (ref 8.9–10.3)
Chloride: 110 mmol/L (ref 98–111)
Creatinine, Ser: 0.47 mg/dL — ABNORMAL LOW (ref 0.61–1.24)
GFR, Estimated: >60 mL/min (ref >60)
Glucose, Bld: 166 mg/dL — ABNORMAL HIGH (ref 70–99)
Potassium: 4 mmol/L (ref 3.5–5.1)
Sodium: 140 mmol/L (ref 135–145)
Total Bilirubin: 0.8 mg/dL (ref 0.3–1.2)
Total Protein: 4.4 g/dL — ABNORMAL LOW (ref 6.5–8.1)

## 2020-09-13 ENCOUNTER — Other Ambulatory Visit (HOSPITAL_COMMUNITY): Payer: Medicare Other

## 2020-09-13 LAB — CULTURE, BLOOD (ROUTINE X 2): Special Requests: ADEQUATE

## 2020-09-14 LAB — CBC
HCT: 29.5 % — ABNORMAL LOW (ref 39.0–52.0)
Hemoglobin: 9.7 g/dL — ABNORMAL LOW (ref 13.0–17.0)
MCH: 31.1 pg (ref 26.0–34.0)
MCHC: 32.9 g/dL (ref 30.0–36.0)
MCV: 94.6 fL (ref 80.0–100.0)
Platelets: 104 10*3/uL — ABNORMAL LOW (ref 150–400)
RBC: 3.12 MIL/uL — ABNORMAL LOW (ref 4.22–5.81)
RDW: 22.4 % — ABNORMAL HIGH (ref 11.5–15.5)
WBC: 6.6 10*3/uL (ref 4.0–10.5)
nRBC: 0 % (ref 0.0–0.2)

## 2020-09-14 LAB — BASIC METABOLIC PANEL
Anion gap: 10 (ref 5–15)
BUN: 26 mg/dL — ABNORMAL HIGH (ref 8–23)
CO2: 18 mmol/L — ABNORMAL LOW (ref 22–32)
Calcium: 8.2 mg/dL — ABNORMAL LOW (ref 8.9–10.3)
Chloride: 112 mmol/L — ABNORMAL HIGH (ref 98–111)
Creatinine, Ser: 0.51 mg/dL — ABNORMAL LOW (ref 0.61–1.24)
GFR, Estimated: >60 mL/min (ref >60)
Glucose, Bld: 132 mg/dL — ABNORMAL HIGH (ref 70–99)
Potassium: 3.4 mmol/L — ABNORMAL LOW (ref 3.5–5.1)
Sodium: 140 mmol/L (ref 135–145)

## 2020-09-15 LAB — MISC LABCORP TEST (SEND OUT)
LabCorp test name: 9
Labcorp test code: 183119

## 2020-09-16 LAB — CULTURE, BLOOD (ROUTINE X 2)
Culture: NO GROWTH
Culture: NO GROWTH
Special Requests: ADEQUATE
Special Requests: ADEQUATE

## 2020-09-17 LAB — BASIC METABOLIC PANEL
Anion gap: 6 (ref 5–15)
BUN: 26 mg/dL — ABNORMAL HIGH (ref 8–23)
CO2: 19 mmol/L — ABNORMAL LOW (ref 22–32)
Calcium: 8 mg/dL — ABNORMAL LOW (ref 8.9–10.3)
Chloride: 109 mmol/L (ref 98–111)
Creatinine, Ser: 0.59 mg/dL — ABNORMAL LOW (ref 0.61–1.24)
GFR, Estimated: >60 mL/min (ref >60)
Glucose, Bld: 137 mg/dL — ABNORMAL HIGH (ref 70–99)
Potassium: 3.4 mmol/L — ABNORMAL LOW (ref 3.5–5.1)
Sodium: 134 mmol/L — ABNORMAL LOW (ref 135–145)

## 2020-09-17 LAB — CBC
HCT: 28.6 % — ABNORMAL LOW (ref 39.0–52.0)
Hemoglobin: 9.6 g/dL — ABNORMAL LOW (ref 13.0–17.0)
MCH: 31.5 pg (ref 26.0–34.0)
MCHC: 33.6 g/dL (ref 30.0–36.0)
MCV: 93.8 fL (ref 80.0–100.0)
Platelets: 93 10*3/uL — ABNORMAL LOW (ref 150–400)
RBC: 3.05 MIL/uL — ABNORMAL LOW (ref 4.22–5.81)
RDW: 21.7 % — ABNORMAL HIGH (ref 11.5–15.5)
WBC: 6.3 10*3/uL (ref 4.0–10.5)
nRBC: 0 % (ref 0.0–0.2)

## 2020-09-17 LAB — CULTURE, BLOOD (ROUTINE X 2): Special Requests: ADEQUATE

## 2020-09-18 LAB — HEPATIC FUNCTION PANEL
ALT: 47 U/L — ABNORMAL HIGH (ref 0–44)
AST: 22 U/L (ref 15–41)
Albumin: 1.9 g/dL — ABNORMAL LOW (ref 3.5–5.0)
Alkaline Phosphatase: 75 U/L (ref 38–126)
Bilirubin, Direct: 0.2 mg/dL (ref 0.0–0.2)
Indirect Bilirubin: 0.5 mg/dL (ref 0.3–0.9)
Total Bilirubin: 0.7 mg/dL (ref 0.3–1.2)
Total Protein: 4.2 g/dL — ABNORMAL LOW (ref 6.5–8.1)

## 2020-09-19 LAB — CBC
HCT: 25.9 % — ABNORMAL LOW (ref 39.0–52.0)
Hemoglobin: 8.4 g/dL — ABNORMAL LOW (ref 13.0–17.0)
MCH: 31.1 pg (ref 26.0–34.0)
MCHC: 32.4 g/dL (ref 30.0–36.0)
MCV: 95.9 fL (ref 80.0–100.0)
Platelets: 93 10*3/uL — ABNORMAL LOW (ref 150–400)
RBC: 2.7 MIL/uL — ABNORMAL LOW (ref 4.22–5.81)
RDW: 22.6 % — ABNORMAL HIGH (ref 11.5–15.5)
WBC: 4.7 10*3/uL (ref 4.0–10.5)
nRBC: 0 % (ref 0.0–0.2)

## 2020-09-19 LAB — BASIC METABOLIC PANEL
Anion gap: 10 (ref 5–15)
BUN: 20 mg/dL (ref 8–23)
CO2: 21 mmol/L — ABNORMAL LOW (ref 22–32)
Calcium: 8.3 mg/dL — ABNORMAL LOW (ref 8.9–10.3)
Chloride: 112 mmol/L — ABNORMAL HIGH (ref 98–111)
Creatinine, Ser: 0.55 mg/dL — ABNORMAL LOW (ref 0.61–1.24)
GFR, Estimated: >60 mL/min (ref >60)
Glucose, Bld: 122 mg/dL — ABNORMAL HIGH (ref 70–99)
Potassium: 3 mmol/L — ABNORMAL LOW (ref 3.5–5.1)
Sodium: 143 mmol/L (ref 135–145)

## 2020-09-20 LAB — POTASSIUM: Potassium: 3.4 mmol/L — ABNORMAL LOW (ref 3.5–5.1)

## 2020-09-20 NOTE — Progress Notes (Addendum)
PROGRESS NOTE    Brent Farmer  PPI:[id number] DOB: Oct 17, 1951 DOA: 08/22/2020   Brief Narrative:  Brent Farmer is an 69 y.o. male with multiple medical problems include diabetes mellitus, GERD, hypertension, hyperlipidemia who presented to the acute facility on 07/16/2020 for worsening shortness of breath, hypoxemia.  He was found to have COVID-19 infection with pneumonia.  He unfortunately went into worsening respiratory failure and had to be intubated on 08/07/2020.  He failed extubation trials.  His hospital course was complicated by left-sided pneumothorax status post chest tube placement on 08/16/2020 and removed on 08/19/2020 due to dislodgment.  He also had dysphagia and had PEG tube placed on 08/21/2020.  Due to his complex medical problems he was transferred to [facility] and admitted here on 08/22/2020.  After admission here he started having worsening leukocytosis with fever.  He received treatment with multiple antibiotics.  He was treated with cefepime, Flagyl. He also was treated with acyclovir for encephalopathy. However, as soon as the antibiotics are stopped he started having fevers again.  He had a central line which was discontinued and he was started on empiric fluconazole.  Blood cultures ordered on 09/04/2020 showed cryptococcus neoformans.   He was treated with high-dose fluconazole.  Repeat blood cultures from 09/07/2020 again showed cryptococcus neoformans.  Therefore we had to start him on amphotericin B and flucytosine.  Assessment & Plan: Active Problems:  Acute on chronic respiratory failure with hypoxia  Fungemia with cryptococcus neoformans Fever/leukocytosis Thrombocytopenia   COVID-19 virus infection Gram-positive bacteremia Herpes stomatitis   Pneumothorax   Acute respiratory distress syndrome (ARDS) due to COVID-19 virus  Diabetes mellitus Protein calorie malnutrition   Acute on chronic respiratory failure with hypoxemia: Patient initially  had COVID-19 infection.  Subsequently developed secondary bacterial pneumonia.  He was treated for COVID-19 infection in the acute facility.  Here he has had on and off fevers and leukocytosis and has received multiple antibiotic treatments. However, as soon as antibiotics were stopped he started having fever and leukocytosis.  Due to ongoing fever, leukocytosis he was treated with Levaquin was to cover for atypical organisms.  He also had gram-positive bacteremia treated IV vancomycin.  Empiric Fluconazole was started.  Blood cultures x2 showed cryptococcus neoformans.  More recent blood cultures were positive while he was on the fluconazole therefore we had to treat him with amphotericin and flucytosine.  Now switched to voriconazole. His respiratory status appears stable at this time.  However, he is high risk for recurrent pneumonia.  If he starts having any worsening shortness of breath would recommend CT of the chest to better evaluate.  Fungemia: He had blood cultures from 09/04/2020 which were done due to fever and worsening leukocytosis.  Blood culture showed cryptococcus.  He was on low-dose fluconazole which was changed to high dose.  His repeat blood cultures from 09/07/2020 again showed the cryptococcus.  At this time the exact source is unclear.  His mentation is fine and he is awake and oriented x3.  No signs of encephalopathy or meningeal symptoms at this time.  Because the repeat blood cultures that were positive were done while he was on the high-dose fluconazole  we had no choice but to treat him with  amphotericin B and flucytosine.  He received 1 week of amphotericin B and flucytosine.  Now switched to voriconazole.  Repeat blood cultures from 09/11/2020 and 09/17/2020 did not show any growth so far.  Tentative plan is to treat for a total duration of 4-6  weeks.  We requested the lab to set up susceptibilities for the cryptococcus. -Please monitor for visual changes, worsening  thrombocytopenia with voriconazole.  He already had decreased platelets likely from systemic infection/fungemia?  If he has any further drop in the platelets or WBC count or any visual disturbance we may need to consider stopping the voriconazole.  Fever/leukocytosis: He received treatment with IV vancomycin, Levaquin, high-dose fluconazole.  His blood culture showing gram-positive cocci which turned out to be a staph epidermidis.  He also had cryptococcus neoformans on his blood cultures. His previous chest x-ray from 09/09/2020 chest imaging concerning for pneumonia.  Currently his respite status appears to be stable.  He is afebrile without leukocytosis.   Lab work today showed WBC count 4.7.  Continue to monitor closely. Please monitor BUN/creatinine closely while on the antimicrobials and also monitor CBC and CMP.  COVID-19 infection: He was treated for COVID-19 infection at the acute facility.  Here he received treatment with hydroxyurea.  He also had ARDS due to COVID-19 infection.  Continue supportive management per the primary team.  Herpes stomatitis: Treated with IV acyclovir.  Improved.  Pneumothorax: He had chest tube placed previously.  Now removed.  Continue management per the primary team.  Diabetes mellitus: Continue to monitor Accu-Cheks, management of diabetes per the primary team.  Protein calorie malnutrition: Management per the primary team.  Due to his complex medical problems he is high risk for worsening and decompensation. Plan of care discussed with the patient, primary team and pharmacy.    Subjective: He is complaining of generalized weakness.  Otherwise no major complaints at this time.  Objective: Vitals: Temperature 98.5, pulse 87, respiratory rate 24, blood pressure 121/63, oxygen saturation 96%  Examination: Constitutional: Frail, ill-appearing male, awake Head: Atraumatic, normocephalic Eyes: PERLA, EOMI  ENMT: external ears and nose appear normal,  normal hearing, moist oral mucosa Neck: Supple, normal ROM, no masses CVS: S1-S2  Respiratory:  Occasional rhonchi, no wheezing Abdomen: soft nontender, positive bowel sounds Musculoskeletal: No edema Neuro: He has debility with generalized weakness, otherwise nonfocal Psych: stable mood and affect Skin: no rashes    Data Reviewed: I have personally reviewed following labs and imaging studies  CBC: Recent Labs  Lab 09/14/20 0443 09/17/20 0511 09/19/20 0610  WBC 6.6 6.3 4.7  HGB 9.7* 9.6* 8.4*  HCT 29.5* 28.6* 25.9*  MCV 94.6 93.8 95.9  PLT 104* 93* 93*    Basic Metabolic Panel: Recent Labs  Lab 09/14/20 0443 09/17/20 0511 09/19/20 0610 09/20/20 1004  NA 140 134* 143  --   K 3.4* 3.4* 3.0* 3.4*  CL 112* 109 112*  --   CO2 18* 19* 21*  --   GLUCOSE 132* 137* 122*  --   BUN 26* 26* 20  --   CREATININE 0.51* 0.59* 0.55*  --   CALCIUM 8.2* 8.0* 8.3*  --     GFR: CrCl cannot be calculated (Unknown ideal weight.).  Liver Function Tests: Recent Labs  Lab 09/18/20 0402  AST 22  ALT 47*  ALKPHOS 75  BILITOT 0.7  PROT 4.2*  ALBUMIN 1.9*    CBG: No results for input(s): GLUCAP in the last 168 hours.   Recent Results (from the past 240 hour(s))  Culture, blood (Routine X 2) w Reflex to ID Panel     Status: None   Collection Time: 09/11/20 11:44 AM   Specimen: BLOOD RIGHT HAND  Result Value Ref Range Status   Specimen Description BLOOD RIGHT HAND  Final   Special Requests   Final    BOTTLES DRAWN AEROBIC ONLY Blood Culture adequate volume   Culture   Final    NO GROWTH 5 DAYS Performed at [facility], [address], [place], [place] [address]    Report Status 09/16/2020 FINAL  Final  Culture, blood (Routine X 2) w Reflex to ID Panel     Status: None   Collection Time: 09/11/20  1:15 PM   Specimen: BLOOD RIGHT FOREARM  Result Value Ref Range Status   Specimen Description BLOOD RIGHT FOREARM  Final   Special Requests   Final    BOTTLES DRAWN  AEROBIC AND ANAEROBIC Blood Culture adequate volume   Culture   Final    NO GROWTH 5 DAYS Performed at [facility], [address], [place], [place] [address]    Report Status 09/16/2020 FINAL  Final  Culture, blood (routine x 2)     Status: None (Preliminary result)   Collection Time: 09/17/20  9:48 AM   Specimen: BLOOD RIGHT HAND  Result Value Ref Range Status   Specimen Description BLOOD RIGHT HAND  Final   Special Requests   Final    BOTTLES DRAWN AEROBIC AND ANAEROBIC Blood Culture adequate volume   Culture   Final    NO GROWTH 3 DAYS Performed at [facility], [address], [place], [place] [address]    Report Status PENDING  Incomplete  Culture, blood (routine x 2)     Status: None (Preliminary result)   Collection Time: 09/17/20 10:17 AM   Specimen: BLOOD LEFT HAND  Result Value Ref Range Status   Specimen Description BLOOD LEFT HAND  Final   Special Requests   Final    BOTTLES DRAWN AEROBIC AND ANAEROBIC Blood Culture adequate volume   Culture   Final    NO GROWTH 3 DAYS Performed at [facility], [address] [place], [place] [address]    Report Status PENDING  Incomplete       Radiology Studies: No results found.   Scheduled Meds: Please see MAR  Vonzella Nipple, MD  09/20/2020, 4:45 PM

## 2020-09-21 LAB — CBC WITH DIFFERENTIAL/PLATELET
Abs Immature Granulocytes: 0.51 10*3/uL — ABNORMAL HIGH (ref 0.00–0.07)
Basophils Absolute: 0 10*3/uL (ref 0.0–0.1)
Basophils Relative: 0 %
Eosinophils Absolute: 0 10*3/uL (ref 0.0–0.5)
Eosinophils Relative: 0 %
HCT: 26.2 % — ABNORMAL LOW (ref 39.0–52.0)
Hemoglobin: 8.9 g/dL — ABNORMAL LOW (ref 13.0–17.0)
Immature Granulocytes: 7 %
Lymphocytes Relative: 17 %
Lymphs Abs: 1.3 10*3/uL (ref 0.7–4.0)
MCH: 32.4 pg (ref 26.0–34.0)
MCHC: 34 g/dL (ref 30.0–36.0)
MCV: 95.3 fL (ref 80.0–100.0)
Monocytes Absolute: 0.6 10*3/uL (ref 0.1–1.0)
Monocytes Relative: 8 %
Neutro Abs: 5.1 10*3/uL (ref 1.7–7.7)
Neutrophils Relative %: 68 %
Platelets: 105 10*3/uL — ABNORMAL LOW (ref 150–400)
RBC: 2.75 MIL/uL — ABNORMAL LOW (ref 4.22–5.81)
RDW: 22.4 % — ABNORMAL HIGH (ref 11.5–15.5)
Smear Review: DECREASED
WBC: 7.5 10*3/uL (ref 4.0–10.5)
nRBC: 0 % (ref 0.0–0.2)

## 2020-09-21 LAB — POTASSIUM: Potassium: 3.5 mmol/L (ref 3.5–5.1)

## 2020-09-22 LAB — CULTURE, BLOOD (ROUTINE X 2)
Culture: NO GROWTH
Culture: NO GROWTH
Special Requests: ADEQUATE
Special Requests: ADEQUATE

## 2020-09-23 LAB — BASIC METABOLIC PANEL
Anion gap: 8 (ref 5–15)
BUN: 27 mg/dL — ABNORMAL HIGH (ref 8–23)
CO2: 23 mmol/L (ref 22–32)
Calcium: 8.5 mg/dL — ABNORMAL LOW (ref 8.9–10.3)
Chloride: 110 mmol/L (ref 98–111)
Creatinine, Ser: 0.58 mg/dL — ABNORMAL LOW (ref 0.61–1.24)
GFR, Estimated: >60 mL/min (ref >60)
Glucose, Bld: 97 mg/dL (ref 70–99)
Potassium: 3.1 mmol/L — ABNORMAL LOW (ref 3.5–5.1)
Sodium: 141 mmol/L (ref 135–145)

## 2020-09-23 LAB — CBC
HCT: 28.5 % — ABNORMAL LOW (ref 39.0–52.0)
Hemoglobin: 9.2 g/dL — ABNORMAL LOW (ref 13.0–17.0)
MCH: 31 pg (ref 26.0–34.0)
MCHC: 32.3 g/dL (ref 30.0–36.0)
MCV: 96 fL (ref 80.0–100.0)
Platelets: 123 10*3/uL — ABNORMAL LOW (ref 150–400)
RBC: 2.97 MIL/uL — ABNORMAL LOW (ref 4.22–5.81)
RDW: 21.7 % — ABNORMAL HIGH (ref 11.5–15.5)
WBC: 6.4 10*3/uL (ref 4.0–10.5)
nRBC: 0 % (ref 0.0–0.2)

## 2020-09-23 LAB — NOVEL CORONAVIRUS, NAA (HOSP ORDER, SEND-OUT TO REF LAB; TAT 18-24 HRS): SARS-CoV-2, NAA: NOT DETECTED

## 2022-03-04 IMAGING — DX DG CHEST 1V PORT
1 series · 1 of 1 positions shown · non-contrast
Comparison: Yesterday

CLINICAL DATA: Pneumothorax and chest tube

EXAM:
PORTABLE CHEST 1 VIEW

[chest ap]
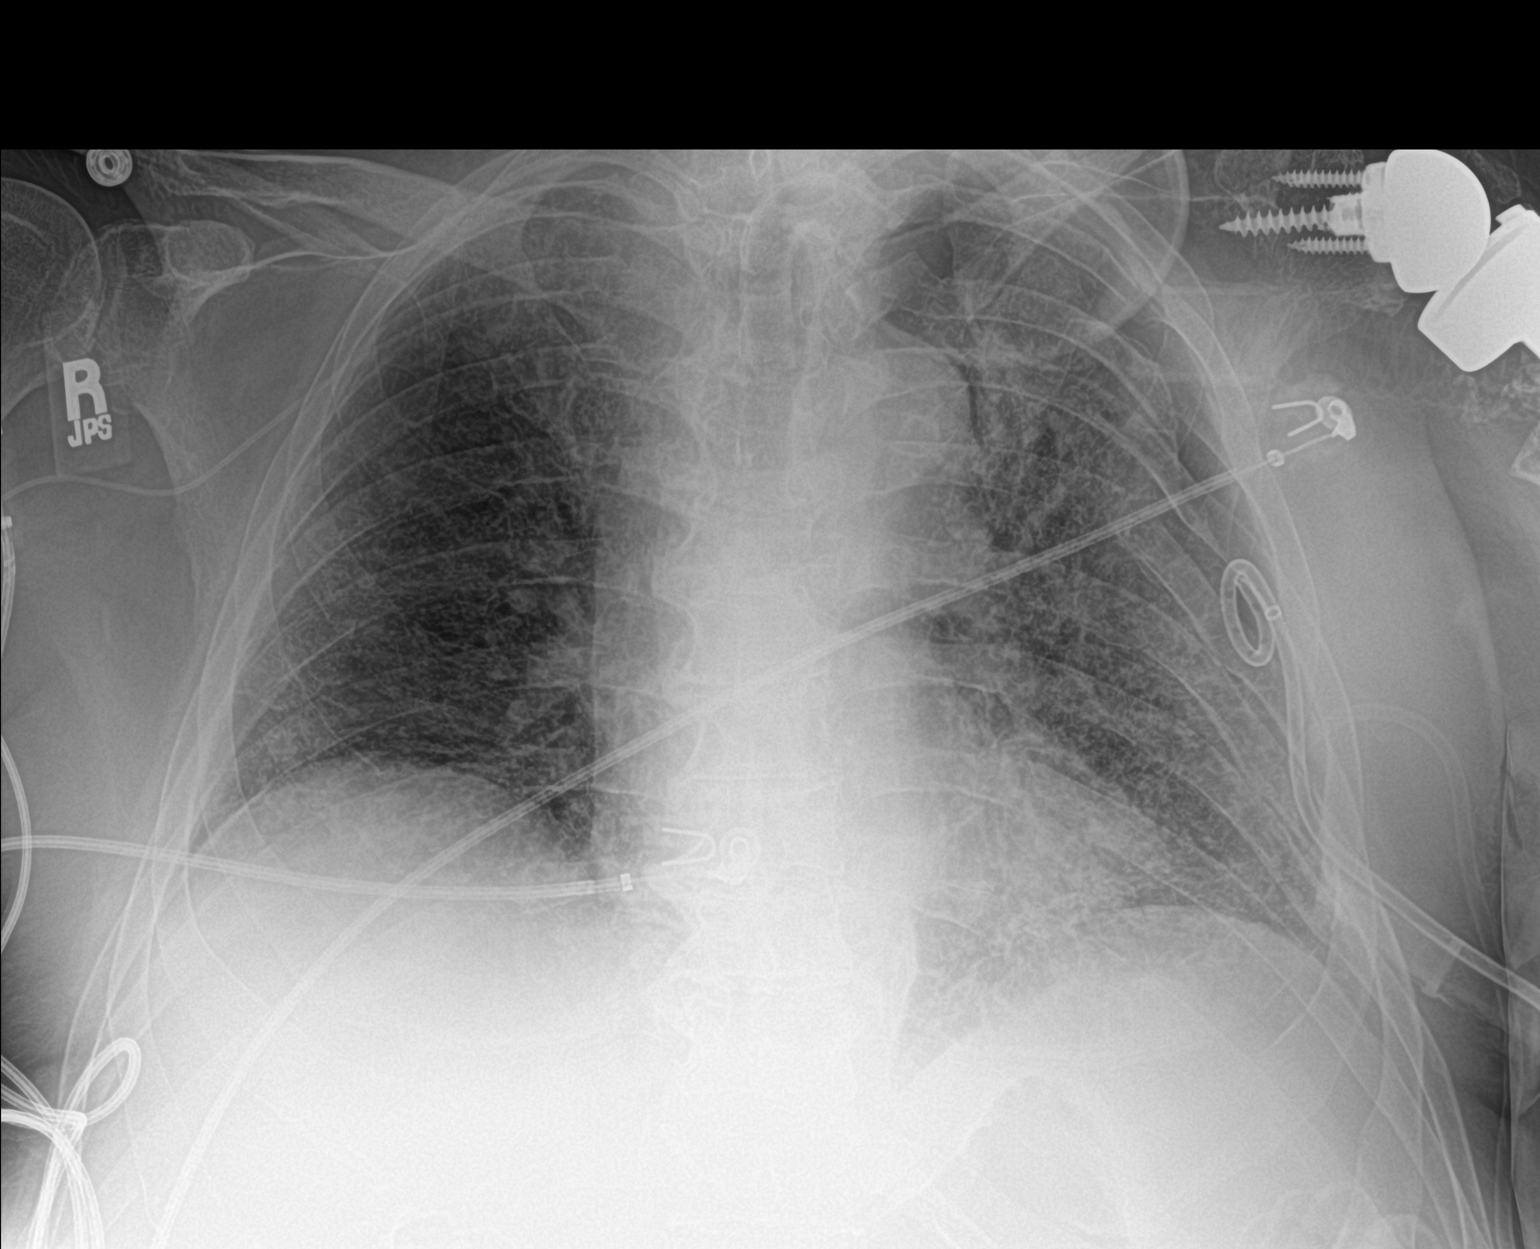

[1 of 1 positions shown; findings below may reference images not displayed]

FINDINGS: Left chest tube in stable position. Apicolateral left pneumothorax
which is small to moderate and decreased from prior. Diffuse coarse
interstitial opacities. Right PICC with tip at the right subclavian
to brachiocephalic level. Tracheostomy tube in place. Normal heart
size.
IMPRESSION: Decreased left pneumothorax with stable chest tube position.

## 2022-03-14 IMAGING — DX DG CHEST 1V PORT
1 series · 1 of 1 positions shown · non-contrast
Comparison: September 09, 2020 study obtained earlier in the day and
multiple recent prior studies

CLINICAL DATA: Central catheter placement

EXAM:
PORTABLE CHEST 1 VIEW

[chest ap]
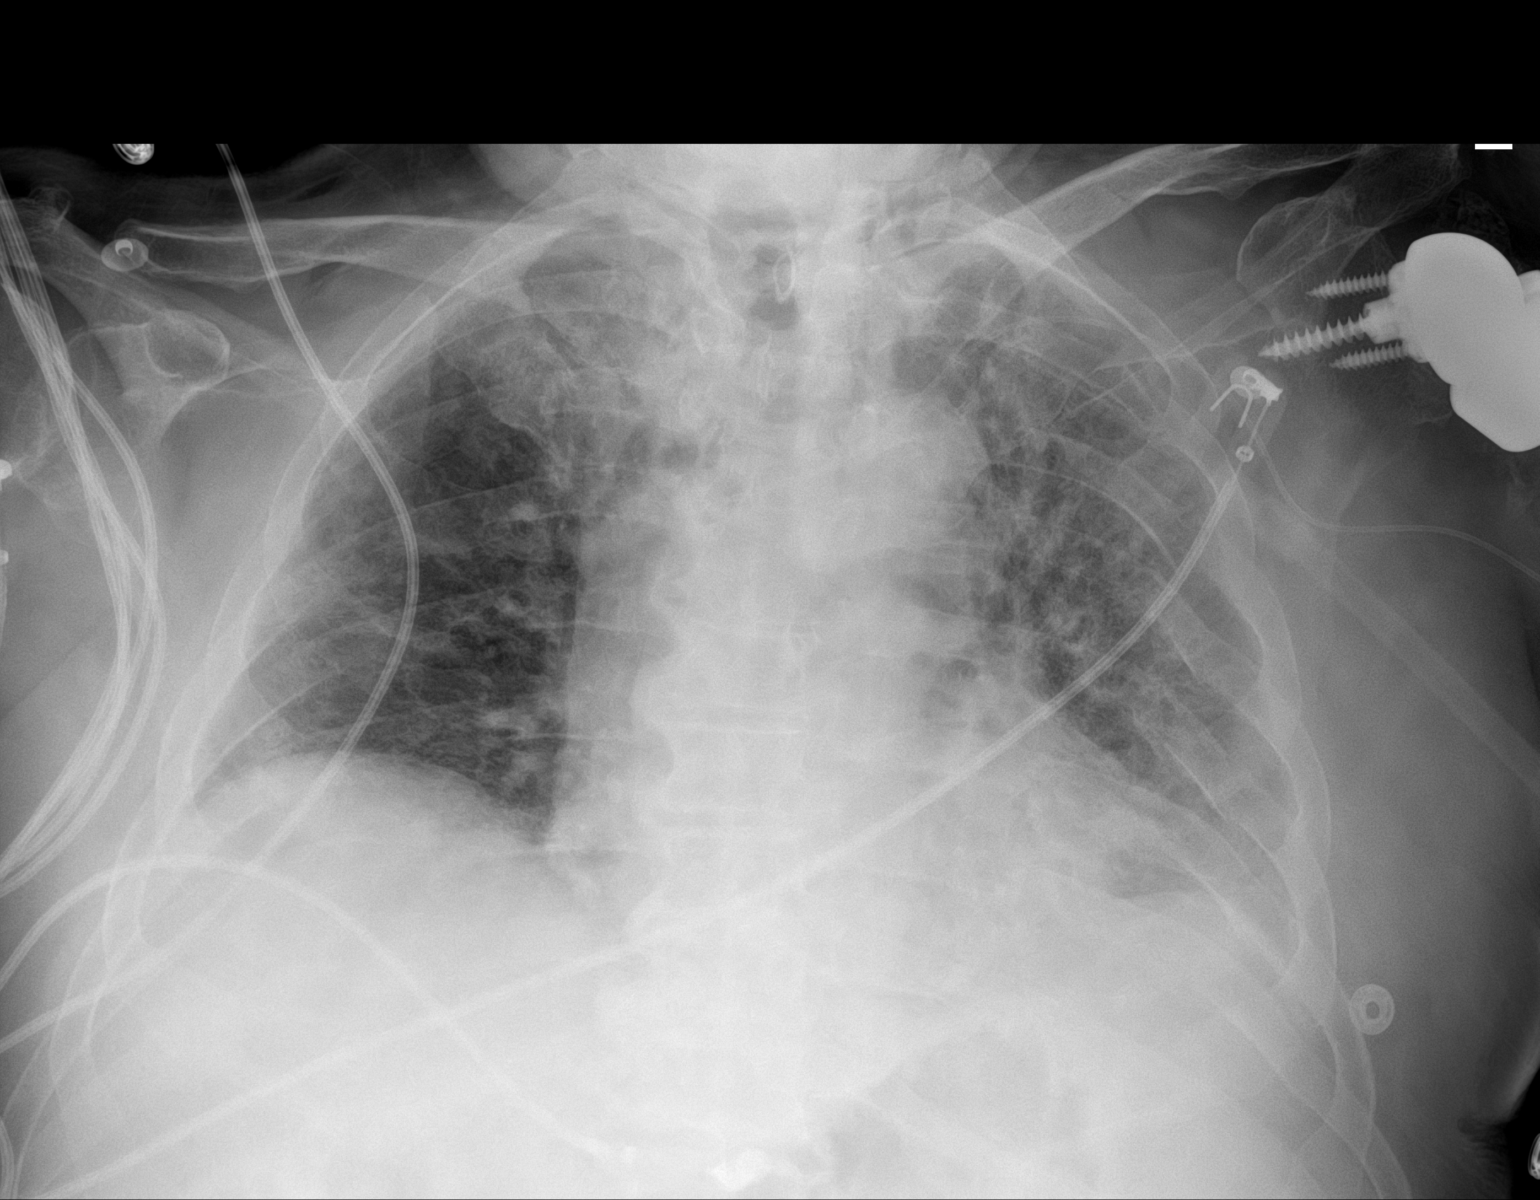

[1 of 1 positions shown; findings below may reference images not displayed]

FINDINGS: Tracheostomy no longer evident. Peripherally inserted central
catheter on the left has its tip in the left innominate vein. No
pneumothorax. There are areas of underlying apparent fibrosis. No
edema or airspace opacity. Heart size and pulmonary vascularity
normal. No adenopathy. Total shoulder replacement on the left noted.
Healed rib fractures noted on the left, stable.
IMPRESSION: Tracheostomy no longer appreciable. Central catheter tip in left
innominate vein. No pneumothorax. Underlying fibrotic type changes.
No edema or airspace opacity. Stable cardiac silhouette.

## 2022-03-14 IMAGING — DX DG CHEST 1V PORT
1 series · 1 of 1 positions shown · non-contrast
Comparison: 09/07/2020

CLINICAL DATA: Follow-up pneumothorax.  COVID

EXAM:
PORTABLE CHEST 1 VIEW

[chest ap]
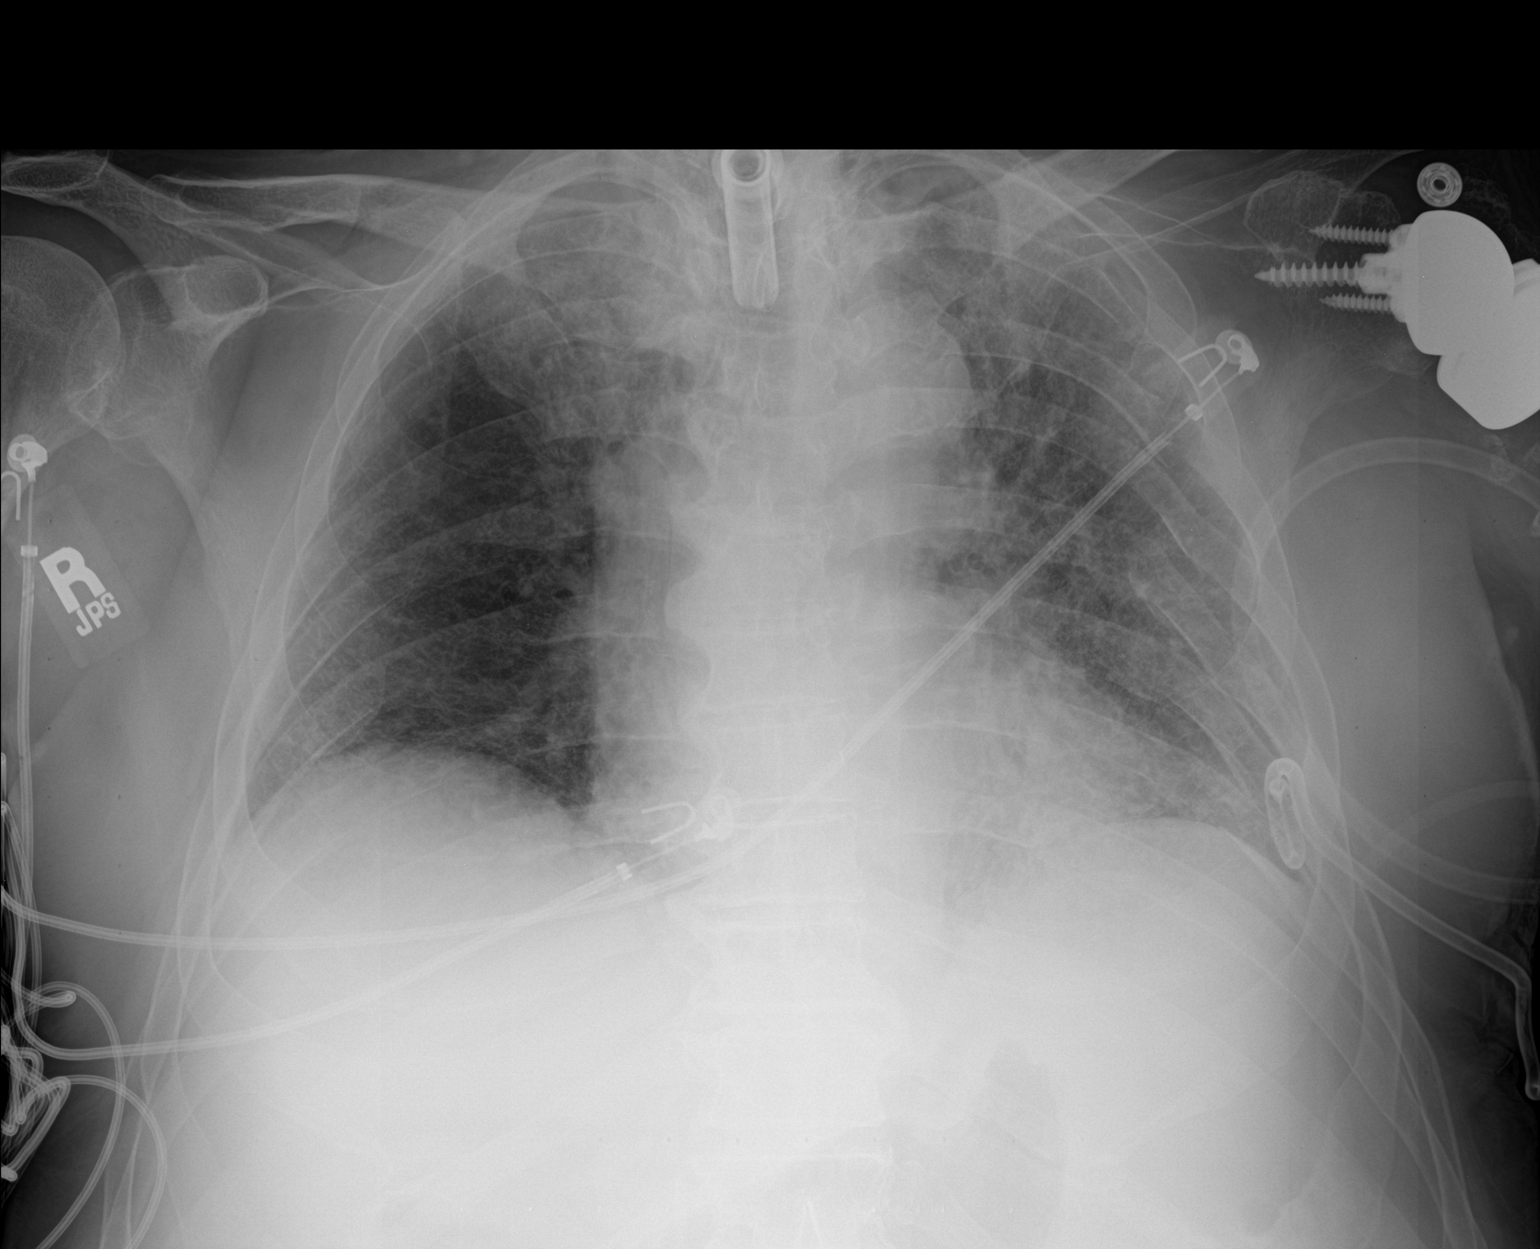

[1 of 1 positions shown; findings below may reference images not displayed]

FINDINGS: Tracheostomy tube is present. Shallow inspiration with mild cardiac
enlargement. Coarse infiltrates or scarring in the lungs mostly on
the left. No pleural effusions. Subcutaneous emphysema in the base
of the neck with likely extension into the mediastinum. No
pneumothorax. Old left rib fractures. Old resection or resorption of
the distal right clavicle. Tracheostomy tube and postoperative
changes in the left shoulder. Similar appearance to previous study,
allowing for differences in technique.
IMPRESSION: Coarse infiltrates or scarring in the lungs, mostly on the left.
Subcutaneous and mediastinal emphysema. No significant change since
previous studies.
